# Patient Record
Sex: Female | Born: 1980 | Race: White | Hispanic: No | Marital: Single | Smoking: Never smoker
Health system: Southern US, Community
[De-identification: ages and names within clinical notes are randomized; demographics above are authoritative.]

## PROBLEM LIST (undated history)

## (undated) DIAGNOSIS — I10 Essential (primary) hypertension: Secondary | ICD-10-CM

## (undated) DIAGNOSIS — F419 Anxiety disorder, unspecified: Secondary | ICD-10-CM

## (undated) HISTORY — PX: GALLBLADDER SURGERY: SHX652

## (undated) HISTORY — DX: Essential (primary) hypertension: I10

## (undated) HISTORY — PX: CHOLECYSTECTOMY: SHX55

## (undated) HISTORY — DX: Anxiety disorder, unspecified: F41.9

---

## 2007-09-23 ENCOUNTER — Encounter: Admission: RE | Admit: 2007-09-23 | Discharge: 2007-09-23 | Payer: Self-pay | Admitting: Family Medicine

## 2009-05-15 ENCOUNTER — Encounter: Admission: RE | Admit: 2009-05-15 | Discharge: 2009-05-15 | Payer: Self-pay | Admitting: Gastroenterology

## 2009-05-24 ENCOUNTER — Encounter: Admission: RE | Admit: 2009-05-24 | Discharge: 2009-05-24 | Payer: Self-pay | Admitting: Gastroenterology

## 2009-08-20 ENCOUNTER — Encounter: Admission: RE | Admit: 2009-08-20 | Discharge: 2009-08-20 | Payer: Self-pay | Admitting: Family Medicine

## 2010-06-02 ENCOUNTER — Encounter: Payer: Self-pay | Admitting: Gastroenterology

## 2010-06-03 ENCOUNTER — Encounter: Payer: Self-pay | Admitting: Gastroenterology

## 2010-11-11 IMAGING — US US ABDOMEN COMPLETE
1 series · 14 of 25 positions shown · non-contrast
Comparison: None.

CLINICAL DATA: Right upper quadrant abdominal pain.

COMPLETE ABDOMINAL ULTRASOUND

[Series 1: us abdomen complete · 0.24mm/px · 14 of 62 slices shown]
[im 1/62]
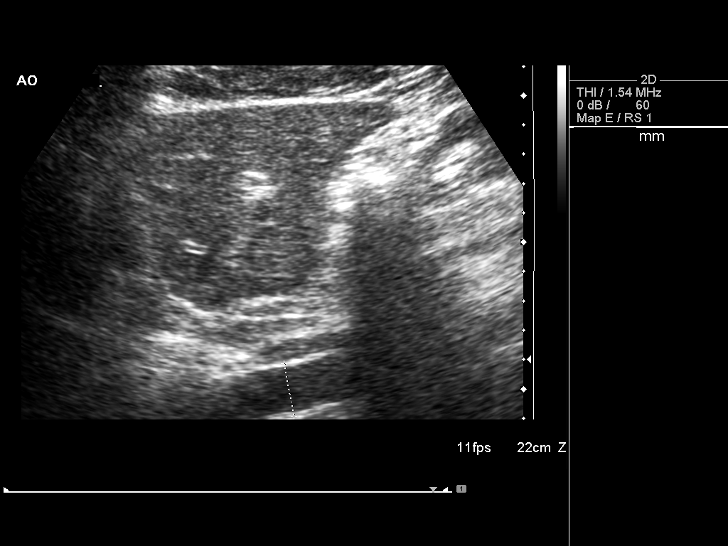
[im 6/62]
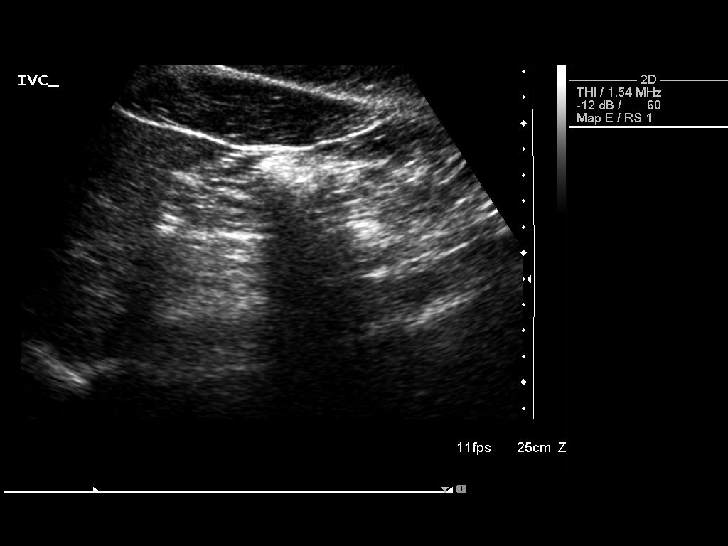
[im 11/62]
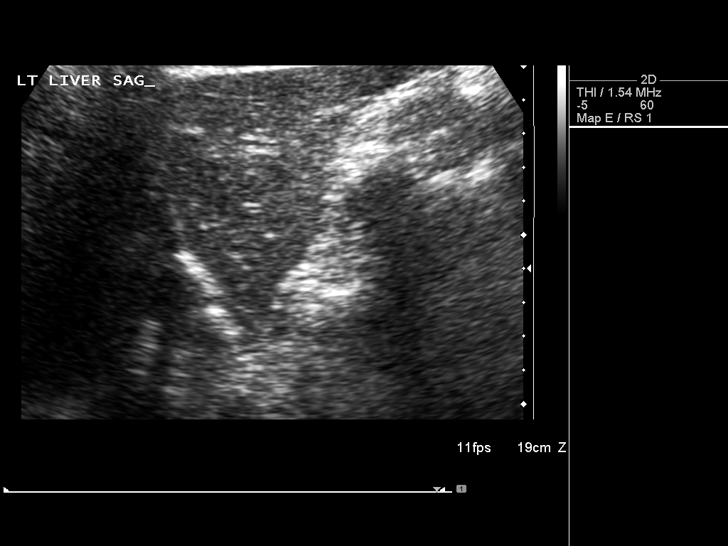
[im 16/62]
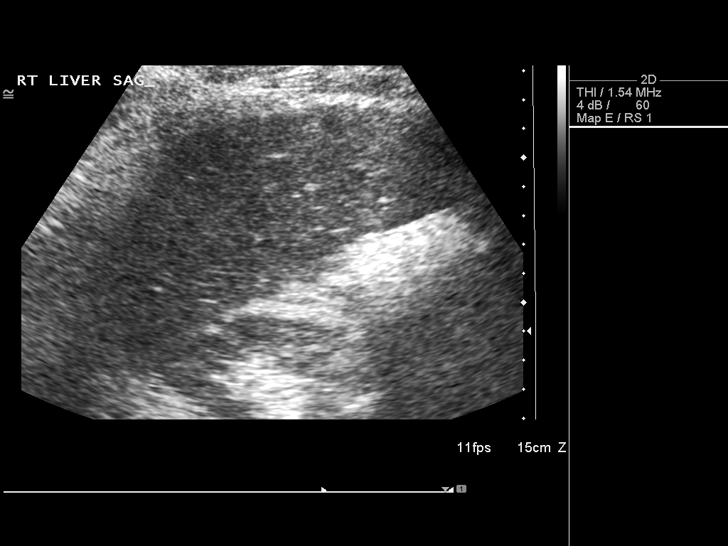
[im 21/62]
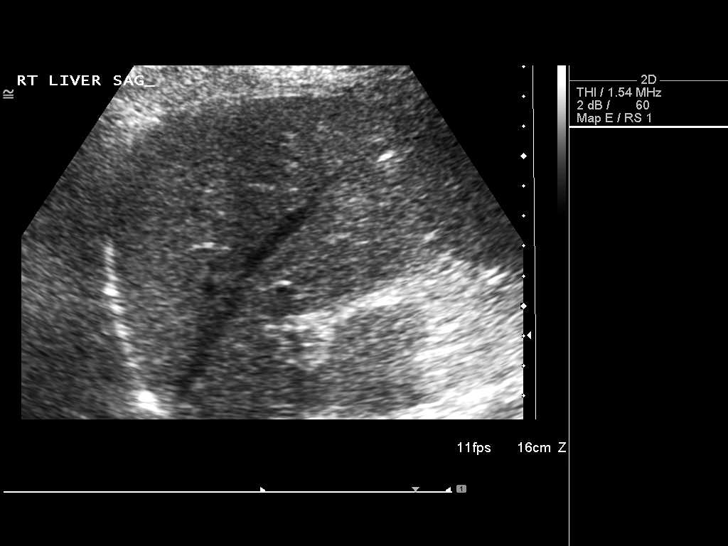
[im 23/62]
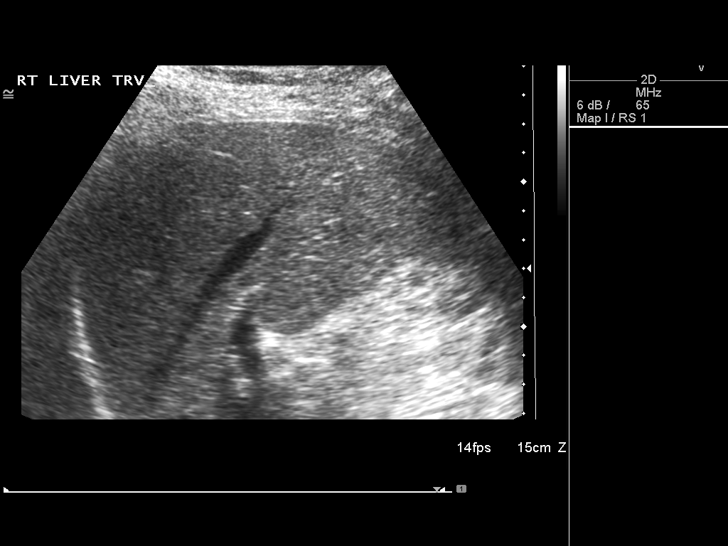
[im 28/62]
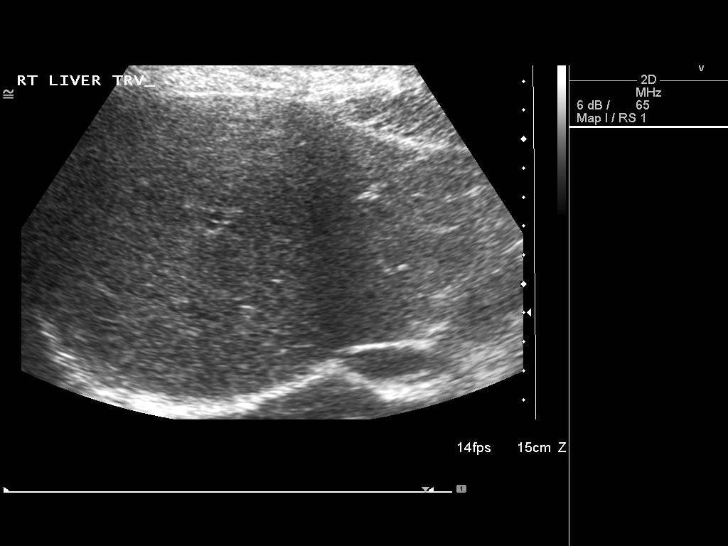
[im 34/62]
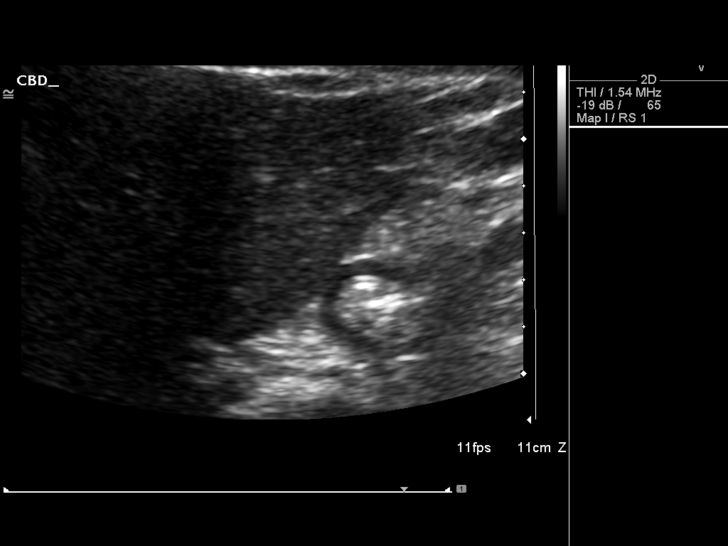
[im 39/62]
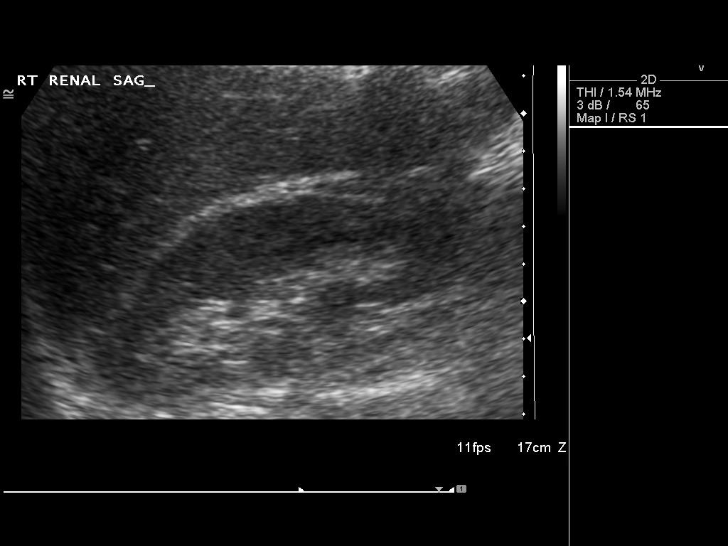
[im 41/62]
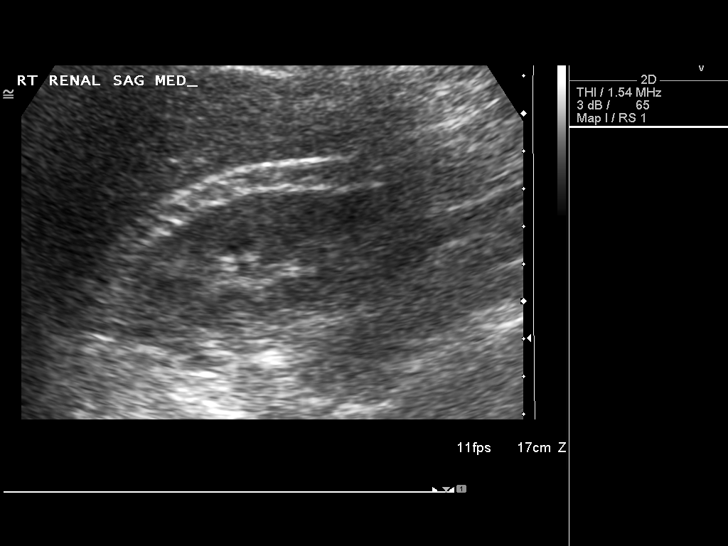
[im 46/62]
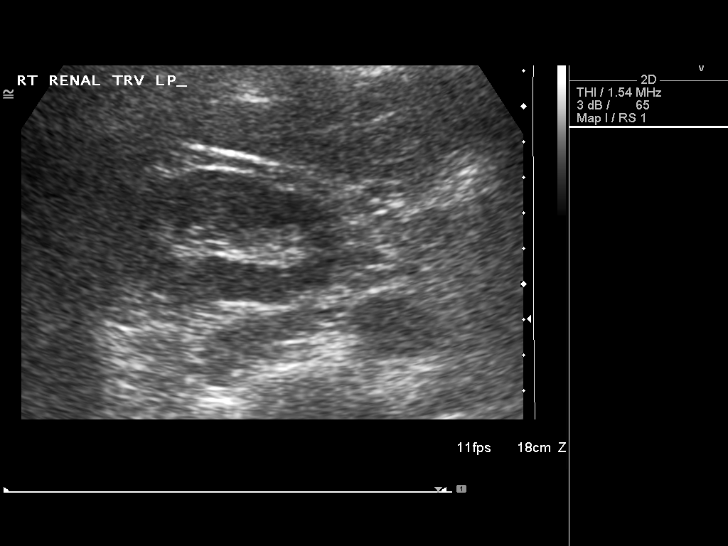
[im 51/62]
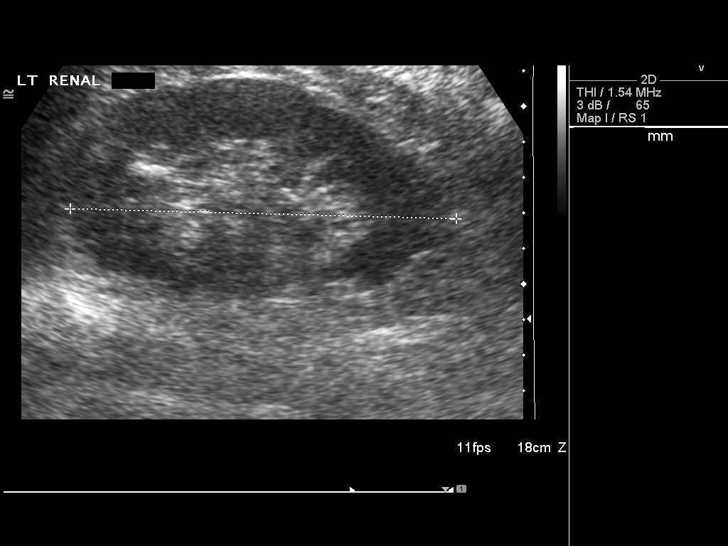
[im 56/62]
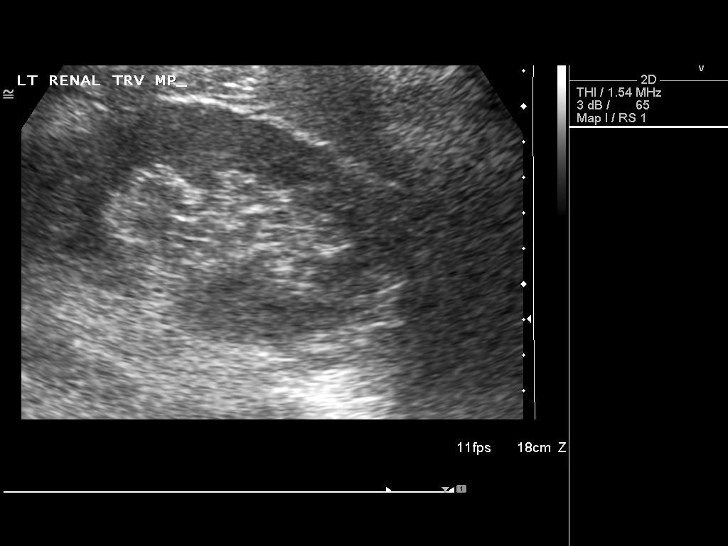
[im 62/62]
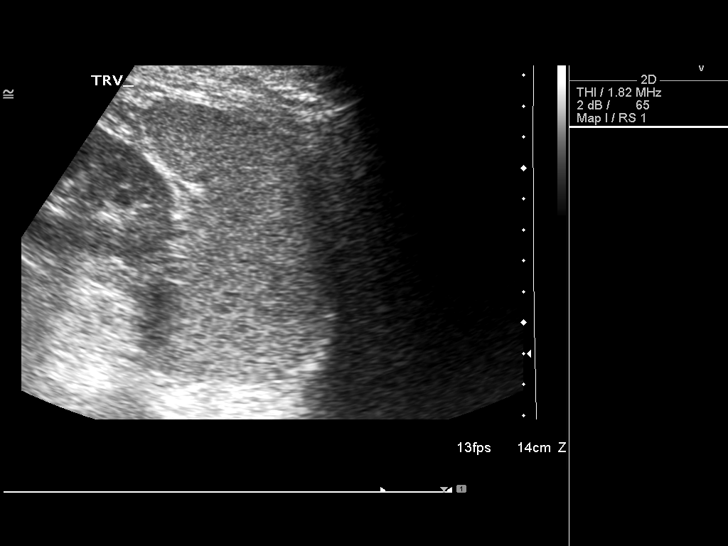

[14 of 25 positions shown; findings below may reference images not displayed]

FINDINGS: Gallbladder:  The gallbladder has previously been removed.

Common bile duct:  .  The common bile duct is normal measuring
mm in diameter.

Liver:  The liver has a normal echogenic pattern.  No ductal
dilatation is seen.

IVC:  Appears normal.

Pancreas:  The pancreatic tail is partially obscured by bowel gas.

Spleen:  The spleen is normal measuring 6.6 cm sagittally.

Right Kidney:  No hydronephrosis is seen.  The right kidney
measures 10.2 cm sagittally.

Left Kidney:  No hydronephrosis.  The left kidney measures 10.9 cm.

Abdominal aorta:  The abdominal aorta is normal in caliber.
IMPRESSION: 1.  Negative ultrasound the abdomen.
2. Prior cholecystectomy.
3.  The tail of the pancreas is obscured by bowel gas.

## 2012-06-06 ENCOUNTER — Ambulatory Visit (INDEPENDENT_AMBULATORY_CARE_PROVIDER_SITE_OTHER): Payer: Commercial Managed Care - PPO | Admitting: Physician Assistant

## 2012-06-06 VITALS — BP 136/88 | HR 96 | Temp 98.7°F | Resp 18 | Ht 66.0 in | Wt 231.0 lb

## 2012-06-06 DIAGNOSIS — J029 Acute pharyngitis, unspecified: Secondary | ICD-10-CM

## 2012-06-06 DIAGNOSIS — I10 Essential (primary) hypertension: Secondary | ICD-10-CM | POA: Insufficient documentation

## 2012-06-06 DIAGNOSIS — F419 Anxiety disorder, unspecified: Secondary | ICD-10-CM | POA: Insufficient documentation

## 2012-06-06 DIAGNOSIS — J069 Acute upper respiratory infection, unspecified: Secondary | ICD-10-CM

## 2012-06-06 MED ORDER — HYDROCOD POLST-CHLORPHEN POLST 10-8 MG/5ML PO LQCR: 5.0000 mL | Freq: Two times a day (BID) | ORAL | Status: DC

## 2012-06-06 MED ORDER — MAGIC MOUTHWASH W/LIDOCAINE: ORAL | Status: DC

## 2012-06-06 MED ORDER — PROMETHAZINE-DM 6.25-15 MG/5ML PO SYRP: 5.0000 mL | ORAL_SOLUTION | Freq: Four times a day (QID) | ORAL | Status: DC | PRN

## 2012-06-06 MED ORDER — IPRATROPIUM BROMIDE 0.06 % NA SOLN: 2.0000 | Freq: Three times a day (TID) | NASAL | Status: DC

## 2012-06-06 NOTE — Progress Notes (Signed)
   [address] [place] [address]   Phone [phone]  Subjective:    Patient ID: Jocelyn Lindsey, female    DOB: Mar 18, 1981, 32 y.o.   MRN: [id number]  HPI Pt presents to clinic with 3 day h.o sore throat that is not improving and mild cold symptoms.  She thought she would be getting more congestion and then sore throat would improve but it has not.  She does have a terrible cough that is keeping her up at night.  She has some PND.  She is using OTC Mucinex which typically helps but does not seem to be helping this time.  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  She has no myalgias or HA.   Review of Systems  Constitutional: Positive for chills. Negative for fever.  HENT: Positive for congestion, sore throat, rhinorrhea (yellow/clear) and postnasal drip.   Respiratory: Positive for cough (worse at night).   Genitourinary: Negative.   Neurological: Negative for headaches.       Objective:   Physical Exam  Vitals reviewed. Constitutional: She appears well-developed and well-nourished.  HENT:  Head: Normocephalic and atraumatic.  Right Ear: Hearing, tympanic membrane, external ear and ear canal normal.  Left Ear: Hearing, tympanic membrane, external ear and ear canal normal.  Nose: Mucosal edema (red) present.  Mouth/Throat: Uvula is midline. Posterior oropharyngeal edema present.  Eyes: Conjunctivae normal are normal.  Neck: Neck supple.  Cardiovascular: Normal rate, regular rhythm and normal heart sounds.   Pulmonary/Chest: Effort normal and breath sounds normal.  Lymphadenopathy:    She has no cervical adenopathy.  Skin: Skin is warm and dry.  Psychiatric: She has a normal mood and affect. Her behavior is normal. Judgment and thought content normal.    Results for orders placed in visit on 06/06/12  POCT RAPID STREP A (OFFICE)      Component Value Range   Rapid Strep A Screen Negative  Negative         Assessment & Plan:    1. Acute pharyngitis  POCT rapid strep A, Alum & Mag Hydroxide-Simeth (MAGIC MOUTHWASH W/LIDOCAINE) SOLN  2. Viral URI with cough  ipratropium (ATROVENT) 0.06 % nasal spray, promethazine-dextromethorphan (PROMETHAZINE-DM) 6.25-15 MG/5ML syrup, DISCONTINUED: chlorpheniramine-HYDROcodone (TUSSIONEX PENNKINETIC ER) 10-8 MG/5ML LQCR   Push fluids, symptomatic treatment.

## 2012-09-02 ENCOUNTER — Ambulatory Visit (INDEPENDENT_AMBULATORY_CARE_PROVIDER_SITE_OTHER): Payer: Commercial Managed Care - PPO | Admitting: Family Medicine

## 2012-09-02 VITALS — BP 134/107 | HR 118 | Temp 99.0°F | Resp 18 | Ht 65.25 in | Wt 232.0 lb

## 2012-09-02 DIAGNOSIS — IMO0002 Reserved for concepts with insufficient information to code with codable children: Secondary | ICD-10-CM

## 2012-09-02 DIAGNOSIS — J209 Acute bronchitis, unspecified: Secondary | ICD-10-CM

## 2012-09-02 MED ORDER — BENZONATATE 200 MG PO CAPS: 200.0000 mg | ORAL_CAPSULE | Freq: Three times a day (TID) | ORAL | Status: DC | PRN

## 2012-09-02 MED ORDER — PROMETHAZINE-DM 6.25-15 MG/5ML PO SYRP: 5.0000 mL | ORAL_SOLUTION | Freq: Four times a day (QID) | ORAL | Status: DC | PRN

## 2012-09-02 NOTE — Progress Notes (Signed)
Subjective:    Patient ID: Jocelyn Lindsey, female    DOB: 10/22/1980, 32 y.o.   MRN: [id number] Chief Complaint  Patient presents with  . Cough  . Foot Pain    left    HPI  Spent spring break in [place] w/ 20 kids.  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 Cough is dry, no Shob or CP.  No f/c.  A little nasal congestion but resoluving, was yellow.  But cough is still continuing. Did have a fever blister and left side of face feels sensitive/tender. Using muinex DM and benadryl w/ minimal relief. Not sleeping due to cough.  During trip feet started to swell while walking tons, both feet were equally swollen and would go down in the morning, but now she is having pain to the anterior of her lateral left ankle. Pain improving w/ ace bandage and ice. Has sprained her ankle in the past.    Past Medical History  Diagnosis Date  . Anxiety   . Hypertension    Current Outpatient Prescriptions on File Prior to Visit  Medication Sig Dispense Refill  . ALPRAZolam (XANAX) 0.25 MG tablet Take 0.25 mg by mouth at bedtime as needed.      Marland Kitchen ipratropium (ATROVENT) 0.06 % nasal spray Place 2 sprays into the nose 3 (three) times daily.  15 mL  0  . lisinopril (PRINIVIL,ZESTRIL) 10 MG tablet Take 10 mg by mouth daily.      Marland Kitchen omeprazole (PRILOSEC) 20 MG capsule Take 20 mg by mouth daily.      . sertraline (ZOLOFT) 100 MG tablet Take 100 mg by mouth daily.      . Alum & Mag Hydroxide-Simeth (MAGIC MOUTHWASH W/LIDOCAINE) SOLN 1-2 tsp swish gargle spit q1-2h prn sore throat  120 mL  0  . fish oil-omega-3 fatty acids 1000 MG capsule Take 2 g by mouth daily.       No current facility-administered medications on file prior to visit.   No Known Allergies  Review of Systems  Constitutional: Positive for fatigue. Negative for fever, chills, diaphoresis and activity change.  HENT: Positive for congestion. Negative for sore throat and rhinorrhea.   Respiratory: Positive for  cough. Negative for choking, chest tightness, shortness of breath and wheezing.   Cardiovascular: Positive for leg swelling. Negative for chest pain and palpitations.  Musculoskeletal: Positive for myalgias, joint swelling and arthralgias. Negative for back pain and gait problem.  Skin: Negative for rash.  Neurological: Negative for numbness.  Hematological: Negative for adenopathy.  Psychiatric/Behavioral: Positive for sleep disturbance.      BP 134/107  Pulse 118  Temp(Src) 99 F (37.2 C) (Oral)  Resp 18  Ht 5' 5.25" (1.657 m)  Wt 232 lb (105.235 kg)  BMI 38.33 kg/m2  SpO2 99% Objective:   Physical Exam  Constitutional: She is oriented to person, place, and time. She appears well-developed and well-nourished. No distress.  HENT:  Head: Normocephalic and atraumatic.  Right Ear: Tympanic membrane, external ear and ear canal normal.  Left Ear: Tympanic membrane, external ear and ear canal normal.  Nose: Nose normal. No mucosal edema or rhinorrhea.  Mouth/Throat: Uvula is midline, oropharynx is clear and moist and mucous membranes are normal. No oropharyngeal exudate.  Eyes: Conjunctivae are normal. Right eye exhibits no discharge. Left eye exhibits no discharge. No scleral icterus.  Neck: Normal range of motion. Neck supple.  Cardiovascular: Normal rate, regular rhythm, normal heart sounds and intact  distal pulses.   Pulmonary/Chest: Effort normal and breath sounds normal.  Musculoskeletal:       Left ankle: She exhibits normal range of motion, no swelling, no ecchymosis, no deformity, no laceration and normal pulse. Tenderness. AITFL tenderness found. No lateral malleolus, no medial malleolus, no CF ligament, no posterior TFL, no head of 5th metatarsal and no proximal fibula tenderness found. Achilles tendon normal.  Lymphadenopathy:    She has no cervical adenopathy.  Neurological: She is alert and oriented to person, place, and time.  Skin: Skin is warm and dry. She is not  diaphoretic. No erythema.  Psychiatric: She has a normal mood and affect. Her behavior is normal.      Assessment & Plan:  Acute bronchitis - Plan: promethazine-dextromethorphan (PROMETHAZINE-DM) 6.25-15 MG/5ML syrup - suspect viral etiology due to benign exam and history.  Treat symptomatically, and RTC if worsening.  Ankle sprain and strain, left, sequela - mild overuse injury in area of prior sprains - rec continuing RICE till pain free w/ ace bandage.  Given strengthening exercises to start after all pain completely resolved to avoid repeat injury  Meds ordered this encounter  Medications  . promethazine-dextromethorphan (PROMETHAZINE-DM) 6.25-15 MG/5ML syrup    Sig: Take 5 mLs by mouth 4 (four) times daily as needed for cough.    Dispense:  120 mL    Refill:  1    Order Specific Question:  Supervising Provider    Answer:  DOOLITTLE, ROBERT P [[id number]]  . benzonatate (TESSALON) 200 MG capsule    Sig: Take 1 capsule (200 mg total) by mouth 3 (three) times daily as needed for cough.    Dispense:  30 capsule    Refill:  0

## 2012-09-02 NOTE — Patient Instructions (Addendum)
Acute Bronchitis You have acute bronchitis. This means you have a chest cold. The airways in your lungs are red and sore (inflamed). Acute means it is sudden onset.  CAUSES Bronchitis is most often caused by the same virus that causes a cold. SYMPTOMS   Body aches.  Chest congestion.  Chills.  Cough.  Fever.  Shortness of breath.  Sore throat. TREATMENT  Acute bronchitis is usually treated with rest, fluids, and medicines for relief of fever or cough. Most symptoms should go away after a few days or a week. Increased fluids may help thin your secretions and will prevent dehydration. Your caregiver may give you an inhaler to improve your symptoms. The inhaler reduces shortness of breath and helps control cough. You can take over-the-counter pain relievers or cough medicine to decrease coughing, pain, or fever. A cool-air vaporizer may help thin bronchial secretions and make it easier to clear your chest. Antibiotics are usually not needed but can be prescribed if you smoke, are seriously ill, have chronic lung problems, are elderly, or you are at higher risk for developing complications.Allergies and asthma can make bronchitis worse. Repeated episodes of bronchitis may cause longstanding lung problems. Avoid smoking and secondhand smoke.Exposure to cigarette smoke or irritating chemicals will make bronchitis worse. If you are a cigarette smoker, consider using nicotine gum or skin patches to help control withdrawal symptoms. Quitting smoking will help your lungs heal faster. Recovery from bronchitis is often slow, but you should start feeling better after 2 to 3 days. Cough from bronchitis frequently lasts for 3 to 4 weeks. To prevent another bout of acute bronchitis:  Quit smoking.  Wash your hands frequently to get rid of viruses or use a hand sanitizer.  Avoid other people with cold or virus symptoms.  Try not to touch your hands to your mouth, nose, or eyes. SEEK IMMEDIATE  MEDICAL CARE IF:  You develop increased fever, chills, or chest pain.  You have severe shortness of breath or bloody sputum.  You develop dehydration, fainting, repeated vomiting, or a severe headache.  You have no improvement after 1 week of treatment or you get worse. MAKE SURE YOU:   Understand these instructions.  Will watch your condition.  Will get help right away if you are not doing well or get worse. Document Released: 06/06/2004 Document Revised: 07/22/2011 Document Reviewed: 08/22/2010 [facility] Patient Information 2013 [place], [place]. Acute Ankle Sprain with Phase II Rehab An acute ankle sprain is a partial or complete tear in one or more of the ligaments of the ankle due to traumatic injury. The severity of the injury depends on both the number of ligaments sprained and the grade of sprain. There are 3 grades of sprains.  A grade 1 sprain is a mild sprain. There is a slight pull without obvious tearing. There is no loss of strength, and the muscle and ligament are the correct length.  A grade 2 sprain is a moderate sprain. There is tearing of fibers within the substance of the ligament where it connects two bones or two cartilages. The length of the ligament is increased, and there is usually decreased strength.  A grade 3 sprain is a complete rupture of the ligament and is uncommon. In addition to the grade of sprain, there are 3 types of ankle sprains.  Lateral ankle sprains. This is a sprain of one or more of the 3 ligaments on the outer side (lateral) of the ankle. These are the most common sprains. Medial ankle  sprains. There is one large triangular ligament on the inner side (medial) of the ankle that is susceptible to injury. Medial ankle sprains are less common. Syndesmosis, "high ankle," sprains. The syndesmosis is the ligament that connects the two bones of the lower leg. Syndesmosis sprains usually only occur with very severe ankle sprains. SYMPTOMS  Pain,  tenderness, and swelling in the ankle, starting at the side of injury that may progress to the whole ankle and foot with time.  "Pop" or tearing sensation at the time of injury.  Bruising that may spread to the heel.  Impaired ability to walk soon after injury. CAUSES   Acute ankle sprains are caused by trauma placed on the ankle that temporarily forces or pries the anklebone (talus) out of its normal socket.  Stretching or tearing of the ligaments that normally hold the joint in place (usually due to a twisting injury). RISK INCREASES WITH:  Previous ankle sprain.  Sports in which the foot may land awkwardly (basketball, volleyball, soccer) or walking or running on uneven or rough surfaces.  Shoes with inadequate support to prevent sideways motion when stress occurs.  Poor strength and flexibility.  Poor balance skills.  Contact sports. PREVENTION  Warm up and stretch properly before activity.  Maintain physical fitness:  Ankle and leg flexibility, muscle strength, and endurance.  Cardiovascular fitness.  Balance training activities.  Use proper technique and have a coach correct improper technique.  Taping, protective strapping, bracing, or high-top tennis shoes may help prevent injury. Initially, tape is best. However, it loses most of its support function within 10 to 15 minutes.  Wear proper fitted protective shoes. Combining high-top shoes with taping or bracing is more effective than using either alone.  Provide the ankle with support during sports and practice activities for 12 months following injury. PROGNOSIS   If treated properly, ankle sprains can be expected to recover completely. However, the length of recovery depends on the degree of injury.  A grade 1 sprain usually heals enough in 5 to 7 days to allow modified activity and requires an average of 6 weeks to heal completely.  A grade 2 sprain requires 6 to 10 weeks to heal completely.  A grade 3  sprain requires 12 to 16 weeks to heal.  A syndesmosis sprain often takes more than 3 months to heal. RELATED COMPLICATIONS   Frequent recurrence of symptoms may result in a chronic problem. Appropriately addressing the problem the first time decreases the frequency of recurrence and optimizes healing time. Severity of initial sprain does not predict the likelihood of later instability.  Injury to other structures (bone, cartilage, or tendon).  Chronically unstable or arthritic ankle joint are possible with repeated sprains. TREATMENT Treatment initially involves the use of ice, medicine, and compression bandages to help reduce pain and inflammation. Ankle sprains are usually immobilized in a walking cast or boot to allow for healing. Crutches may be recommended to reduce pressure on the injury. After immobilization, strengthening and stretching exercises may be necessary to regain strength and a full range of motion. Surgery is rarely needed to treat ankle sprains. MEDICATION   Nonsteroidal anti-inflammatory medicines, such as aspirin and ibuprofen (do not take for the first 3 days after injury or within 7 days before surgery), or other minor pain relievers, such as acetaminophen, are often recommended. Take these as directed by your caregiver. Contact your caregiver immediately if any bleeding, stomach upset, or signs of an allergic reaction occur from these medicines.  Ointments  applied to the skin may be helpful.  Pain relievers may be prescribed as necessary by your caregiver. Do not take prescription pain medicine for longer than 4 to 7 days. Use only as directed and only as much as you need. HEAT AND COLD  Cold treatment (icing) is used to relieve pain and reduce inflammation for acute and chronic cases. Cold should be applied for 10 to 15 minutes every 2 to 3 hours for inflammation and pain and immediately after any activity that aggravates your symptoms. Use ice packs or an ice  massage.  Heat treatment may be used before performing stretching and strengthening activities prescribed by your caregiver. Use a heat pack or a warm soak. SEEK IMMEDIATE MEDICAL CARE IF:   Pain, swelling, or bruising worsens despite treatment.  You experience pain, numbness, discoloration, or coldness in the foot or toes.  New, unexplained symptoms develop. (Drugs used in treatment may produce side effects.) EXERCISES  PHASE II EXERCISES RANGE OF MOTION (ROM) AND STRETCHING EXERCISES - Ankle Sprain, Acute-Phase II, Weeks 3 to 4 After your physician, physical therapist, or athletic trainer feels your knee has made progress significant enough to begin more advanced exercises, he or she may recommend completing some of the following exercises. Although each person heals at different rates, most people will be ready for these exercises between 3 and 4 weeks after their injury. Do not begin these exercises until you have your caregiver's permission. He or she may also advise you to continue with the exercises which you completed in Phase I of your rehabilitation. While completing these exercises, remember:   Restoring tissue flexibility helps normal motion to return to the joints. This allows healthier, less painful movement and activity.  An effective stretch should be held for at least 30 seconds.  A stretch should never be painful. You should only feel a gentle lengthening or release in the stretched tissue. RANGE OF MOTION - Ankle Plantar Flexion   Sit with your right / left leg crossed over your opposite knee.  Use your opposite hand to pull the top of your foot and toes toward you.  You should feel a gentle stretch on the top of your foot/ankle. Hold this position for __________. Repeat __________ times. Complete __________ times per day.  RANGE OF MOTION - Ankle Eversion  Sit with your right / left ankle crossed over your opposite knee.  Grip your foot with your opposite hand,  placing your thumb on the top of your foot and your fingers across the bottom of your foot.  Gently push your foot downward with a slight rotation so your littlest toes rise slightly  You should feel a gentle stretch on the inside of your ankle. Hold the stretch for __________ seconds. Repeat __________ times. Complete this exercise __________ times per day.  RANGE OF MOTION - Ankle Inversion  Sit with your right / left ankle crossed over your opposite knee.  Grip your foot with your opposite hand, placing your thumb on the bottom of your foot and your fingers across the top of your foot.  Gently pull your foot so the smallest toe comes toward you and your thumb pushes the inside of the ball of your foot away from you.  You should feel a gentle stretch on the outside of your ankle. Hold the stretch for __________ seconds. Repeat __________ times. Complete this exercise __________ times per day.  STRETCH - Gastrocsoleus  Sit with your right / left leg extended. Holding onto both  ends of a belt or towel, loop it around the ball of your foot.  Keeping your right / left ankle and foot relaxed and your knee straight, pull your foot and ankle toward you using the belt/towel.  You should feel a gentle stretch behind your calf or knee. Hold this position for __________ seconds. Repeat __________ times. Complete this stretch __________ times per day.  RANGE OF MOTION - Ankle Dorsiflexion, Active Assisted  Remove shoes and sit on a chair that is preferably not on a carpeted surface.  Place right / left foot under knee. Extend your opposite leg for support.  Keeping your heel down, slide your right / left foot back toward the chair until you feel a stretch at your ankle or calf. If you do not feel a stretch, slide your bottom forward to the edge of the chair while still keeping your heel down.  Hold this stretch for __________ seconds. Repeat __________ times. Complete this stretch __________  times per day.  STRETCH  Gastroc, Standing   Place hands on wall.  Extend right / left leg and place a folded washcloth under the arch of your foot for support. Keep the front knee somewhat bent.  Slightly point your toes inward on your back foot.  Keeping your right / left heel on the floor and your knee straight, shift your weight toward the wall, not allowing your back to arch.  You should feel a gentle stretch in the calf. Hold this position for __________ seconds. Repeat __________ times. Complete this stretch __________ times per day. STRETCH  Soleus, Standing  Place hands on wall.  Extend right / left leg and place a folded washcloth under the arch of your foot for support. Keep the front knee somewhat bent.  Slightly point your toes inward on your back foot.  Keep your right / left heel on the floor, bend your back knee, and slightly shift your weight over the back leg so that you feel a gentle stretch deep in your back calf.  Hold this position for __________ seconds. Repeat __________ times. Complete this stretch __________ times per day. STRETCH  Gastrocsoleus, Standing Note: This exercise can place a lot of stress on your foot and ankle. Please complete this exercise only if specifically instructed by your caregiver.   Place the ball of your right / left foot on a step, keeping your other foot firmly on the same step.  Hold on to the wall or a rail for balance.  Slowly lift your other foot, allowing your body weight to press your heel down over the edge of the step.  You should feel a stretch in your right / left calf.  Hold this position for __________ seconds.  Repeat this exercise with a slight bend in your knee. Repeat __________ times. Complete this stretch __________ times per day.  STRENGTHENING EXERCISES - Ankle Sprain, Acute-Phase II Around 3 to 4 weeks after your injury, you may progress to some of these exercises in your rehabilitation program. Do not  begin these until you have your caregiver's permission. Although your condition has improved, the Phase I exercises will continue to be helpful and you may continue to complete them. As you complete strengthening exercises, remember:   Strong muscles with good endurance tolerate stress better.  Do the exercises as initially prescribed by your caregiver. Progress slowly with each exercise, gradually increasing the number of repetitions and weight used under his or her guidance.  You may experience muscle soreness  or fatigue, but the pain or discomfort you are trying to eliminate should never worsen during these exercises. If this pain does worsen, stop and make certain you are following the directions exactly. If the pain is still present after adjustments, discontinue the exercise until you can discuss the trouble with your caregiver. STRENGTH - Plantar-flexors, Standing  Stand with your feet shoulder width apart. Steady yourself with a wall or table using as little support as needed.  Keeping your weight evenly spread over the width of your feet, rise up on your toes.*  Hold this position for __________ seconds. Repeat __________ times. Complete this exercise __________ times per day.  *If this is too easy, shift your weight toward your right / left leg until you feel challenged. Ultimately, you may be asked to do this exercise with your right / left foot only. STRENGTH  Dorsiflexors and Plantar-flexors, Heel/toe Walking  Dorsiflexion: Walk on your heels only. Keep your toes as high as possible.  Walk for ____________________ seconds/feet.  Repeat __________ times. Complete __________ times per day.  Plantar flexion: Walk on your toes only. Keep your heels as high as possible.  Walk for ____________________ seconds/feet. Repeat __________ times. Complete __________ times per day.  BALANCE  Tandem Walking  Place your uninjured foot on a line 2 to 4 inches wide and at least 10 feet  long.  Keeping your balance without using anything for extra support, place your right / left heel directly in front of your other foot.  Slowly raise your back foot up, lifting from the heel to the toes, and place it directly in front of the right / left foot.  Continue to walk along the line slowly. Walk for ____________________ feet. Repeat ____________________ times. Complete ____________________ times per day. BALANCE - Inversion/Eversion Use caution, these are advanced level exercises. Do not begin them until you are advised to do so.   Create a balance board using a sturdy board about 1  feet long and at 1 to 1  feet wide and a 1  inch diameter rod or pipe that is as long as the board's width. A copper pipe or a solid broomstick work well.  Stand on a non-carpeted surface near a countertop or wall. Step onto the board so that your feet are hip-width apart and equally straddle the rod/pipe.  Keeping your feet in place, complete these two exercises without shifting your upper body or hips:  Tip the board from side-to-side. Control the movement so the board does not forcefully strike the ground. The board should silently tap the ground.  Tip the board side-to-side without striking the ground. Occasionally pause and maintain a steady position at various points.  Repeat the first two exercises, but use only your right / left foot. Place your right / left foot directly over the rod/pipe. Repeat __________ times. Complete this exercise __________ times a day. BALANCE - Plantar/Dorsi Flexion Use caution, these are advanced level exercises. Do not begin them until you are advised to do so.   Create a balance board using a sturdy board about 1  feet long and at 1 to 1  feet wide and a 1  inch diameter rod or pipe that is as long as the board's width. A copper pipe or a solid broomstick work well.  Stand on a non-carpeted surface near a countertop or wall. Stand on the board so that the  rod/pipe runs under the arches in your feet.  Keeping your feet in place,  complete these two exercises without shifting your upper body or hips:  Tip the board from side-to-side. Control the movement so the board does not forcefully strike the ground. The board should silently tap the ground.  Tip the board side-to-side without striking the ground. Occasionally pause and maintain a steady position at various points.  Repeat the first two exercises, but use only your right / left foot. Stand in the center of the board. Repeat __________ times. Complete this exercise __________ times a day. STRENGTH  Plantar-flexors, Eccentric Note: This exercise can place a lot of stress on your foot and ankle. Please complete this exercise only if specifically instructed by your caregiver.   Place the balls of your feet on a step. With your hands, use only enough support from a wall or rail to keep your balance.  Keep your knees straight and rise up on your toes.  Slowly shift your weight entirely to your toes and pick up your opposite foot. Gently and with controlled movement, lower your weight through your right / left foot so that your heel drops below the level of the step. You will feel a slight stretch in the back of your calf at the ending position.  Use the healthy leg to help rise up onto the balls of both feet, then lower weight only on the right / left leg again. Build up to 15 repetitions. Then progress to 3 consecutive sets of 15 repetitions.*  After completing the above exercise, complete the same exercise with a slight knee bend (about 30 degrees). Again, build up to 15 repetitions. Then progress to 3 consecutive sets of 15 repetitions.* Perform this exercise __________ times per day.  *When you easily complete 3 sets of 15, your physician, physical therapist, or athletic trainer may advise you to add resistance by wearing a backpack filled with additional weight. Document Released: 08/19/2005  Document Revised: 07/22/2011 Document Reviewed: 08/11/2008 [facility] Patient Information 2013 [place], [place].

## 2012-10-18 ENCOUNTER — Emergency Department (HOSPITAL_BASED_OUTPATIENT_CLINIC_OR_DEPARTMENT_OTHER)
Admission: EM | Admit: 2012-10-18 | Discharge: 2012-10-18 | Disposition: A | Discharge status: Home / Self Care | Payer: No Typology Code available for payment source | Attending: Emergency Medicine | Admitting: Emergency Medicine

## 2012-10-18 ENCOUNTER — Encounter (HOSPITAL_BASED_OUTPATIENT_CLINIC_OR_DEPARTMENT_OTHER): Payer: Self-pay | Admitting: Emergency Medicine

## 2012-10-18 DIAGNOSIS — Y92410 Unspecified street and highway as the place of occurrence of the external cause: Secondary | ICD-10-CM | POA: Insufficient documentation

## 2012-10-18 DIAGNOSIS — IMO0002 Reserved for concepts with insufficient information to code with codable children: Secondary | ICD-10-CM | POA: Insufficient documentation

## 2012-10-18 DIAGNOSIS — Y9389 Activity, other specified: Secondary | ICD-10-CM | POA: Insufficient documentation

## 2012-10-18 DIAGNOSIS — I10 Essential (primary) hypertension: Secondary | ICD-10-CM | POA: Insufficient documentation

## 2012-10-18 DIAGNOSIS — S139XXA Sprain of joints and ligaments of unspecified parts of neck, initial encounter: Secondary | ICD-10-CM | POA: Insufficient documentation

## 2012-10-18 DIAGNOSIS — F411 Generalized anxiety disorder: Secondary | ICD-10-CM | POA: Insufficient documentation

## 2012-10-18 DIAGNOSIS — Z79899 Other long term (current) drug therapy: Secondary | ICD-10-CM | POA: Insufficient documentation

## 2012-10-18 DIAGNOSIS — S161XXA Strain of muscle, fascia and tendon at neck level, initial encounter: Secondary | ICD-10-CM

## 2012-10-18 MED ORDER — CYCLOBENZAPRINE HCL 10 MG PO TABS: 10.0000 mg | ORAL_TABLET | Freq: Two times a day (BID) | ORAL | Status: DC | PRN
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
## 2012-10-18 NOTE — ED Notes (Signed)
See md assessment, unable to complete nursing assessment prior to discharge.
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
## 2013-03-18 ENCOUNTER — Other Ambulatory Visit: Payer: Self-pay

## 2013-04-30 ENCOUNTER — Ambulatory Visit
Admission: RE | Admit: 2013-04-30 | Discharge: 2013-04-30 | Disposition: A | Discharge status: Home / Self Care | Payer: Commercial Managed Care - PPO | Source: Ambulatory Visit | Attending: Emergency Medicine | Admitting: Emergency Medicine

## 2013-04-30 ENCOUNTER — Ambulatory Visit: Payer: Commercial Managed Care - PPO

## 2013-04-30 ENCOUNTER — Telehealth: Payer: Self-pay

## 2013-04-30 ENCOUNTER — Ambulatory Visit (INDEPENDENT_AMBULATORY_CARE_PROVIDER_SITE_OTHER): Payer: Commercial Managed Care - PPO | Admitting: Emergency Medicine

## 2013-04-30 VITALS — BP 120/78 | HR 100 | Temp 98.8°F | Resp 18 | Ht 66.0 in | Wt 235.0 lb

## 2013-04-30 DIAGNOSIS — R109 Unspecified abdominal pain: Secondary | ICD-10-CM

## 2013-04-30 DIAGNOSIS — R110 Nausea: Secondary | ICD-10-CM

## 2013-04-30 DIAGNOSIS — R319 Hematuria, unspecified: Secondary | ICD-10-CM

## 2013-04-30 DIAGNOSIS — R209 Unspecified disturbances of skin sensation: Secondary | ICD-10-CM

## 2013-04-30 DIAGNOSIS — R079 Chest pain, unspecified: Secondary | ICD-10-CM

## 2013-04-30 DIAGNOSIS — N200 Calculus of kidney: Secondary | ICD-10-CM

## 2013-04-30 DIAGNOSIS — R202 Paresthesia of skin: Secondary | ICD-10-CM

## 2013-04-30 DIAGNOSIS — R200 Anesthesia of skin: Secondary | ICD-10-CM

## 2013-04-30 LAB — POCT CBC
Granulocyte percent: 66.1 %G (ref 37–80)
MCH, POC: 29.2 pg (ref 27–31.2)
MID (cbc): 0.4 (ref 0–0.9)
MPV: 10.2 fL (ref 0–99.8)
POC Granulocyte: 3.2 (ref 2–6.9)
POC MID %: 7.3 %M (ref 0–12)
Platelet Count, POC: 226 10*3/uL (ref 142–424)
RBC: 4.73 M/uL (ref 4.04–5.48)
WBC: 4.9 10*3/uL (ref 4.6–10.2)

## 2013-04-30 LAB — POCT UA - MICROSCOPIC ONLY
Casts, Ur, LPF, POC: NEGATIVE
Crystals, Ur, HPF, POC: NEGATIVE
Mucus, UA: POSITIVE

## 2013-04-30 LAB — POCT URINALYSIS DIPSTICK
Bilirubin, UA: NEGATIVE
Glucose, UA: NEGATIVE
Ketones, UA: NEGATIVE
Spec Grav, UA: 1.02
pH, UA: 6

## 2013-04-30 MED ORDER — TAMSULOSIN HCL 0.4 MG PO CAPS: 0.4000 mg | ORAL_CAPSULE | Freq: Every day | ORAL | Status: DC

## 2013-04-30 MED ORDER — CIPROFLOXACIN HCL 250 MG PO TABS: 250.0000 mg | ORAL_TABLET | Freq: Two times a day (BID) | ORAL | Status: DC

## 2013-04-30 NOTE — Patient Instructions (Signed)
Ureteral Colic (Kidney Stones) °Ureteral colic is the result of a condition when kidney stones form inside the kidney. Once kidney stones are formed they may move into the tube that connects the kidney with the bladder (ureter). If this occurs, this condition may cause pain (colic) in the ureter.  °CAUSES  °Pain is caused by stone movement in the ureter and the obstruction caused by the stone. °SYMPTOMS  °The pain comes and goes as the ureter contracts around the stone. The pain is usually intense, sharp, and stabbing in character. The location of the pain may move as the stone moves through the ureter. When the stone is near the kidney the pain is usually located in the back and radiates to the belly (abdomen). When the stone is ready to pass into the bladder the pain is often located in the lower abdomen on the side the stone is located. At this location, the symptoms may mimic those of a urinary tract infection with urinary frequency. Once the stone is located here it often passes into the bladder and the pain disappears completely. °TREATMENT  °· Your caregiver will provide you with medicine for pain relief. °· You may require specialized follow-up X-rays. °· The absence of pain does not always mean that the stone has passed. It may have just stopped moving. If the urine remains completely obstructed, it can cause loss of kidney function or even complete destruction of the involved kidney. It is your responsibility and in your interest that X-rays and follow-ups as suggested by your caregiver are completed. Relief of pain without passage of the stone can be associated with severe damage to the kidney, including loss of kidney function on that side. °· If your stone does not pass on its own, additional measures may be taken by your caregiver to ensure its removal. °HOME CARE INSTRUCTIONS  °· Increase your fluid intake. Water is the preferred fluid since juices containing vitamin C may acidify the urine making it  less likely for certain stones (uric acid stones) to pass. °· Strain all urine. A strainer will be provided. Keep all particulate matter or stones for your caregiver to inspect. °· Take your pain medicine as directed. °· Make a follow-up appointment with your caregiver as directed. °· Remember that the goal is passage of your stone. The absence of pain does not mean the stone is gone. Follow your caregiver's instructions. °· Only take over-the-counter or prescription medicines for pain, discomfort, or fever as directed by your caregiver. °SEEK MEDICAL CARE IF:  °· Pain cannot be controlled with the prescribed medicine. °· You have a fever. °· Pain continues for longer than your caregiver advises it should. °· There is a change in the pain, and you develop chest discomfort or constant abdominal pain. °· You feel faint or pass out. °MAKE SURE YOU:  °· Understand these instructions. °· Will watch your condition. °· Will get help right away if you are not doing well or get worse. °Document Released: 02/06/2005 Document Revised: 08/24/2012 Document Reviewed: 10/24/2010 °[employer]® Patient Information ©2014 [employer]. ° °

## 2013-04-30 NOTE — Telephone Encounter (Signed)
Patient states Dr. Cleta Alberts told her he would call her back with the [place] results, but the [place] tech told her he would not get them until Monday. She wants to make sure Dr. Cleta Alberts will call her tonight.

## 2013-04-30 NOTE — Progress Notes (Signed)
Subjective:    Patient ID: Jocelyn Lindsey, female    DOB: [birth year], 32 y.o.   MRN: [id number]  HPI Patient presents today with pelvic pain. She felt maybe it was a UTI. She has urgency with out being able to urinate. She has never had a kidney stone. She has lower abdominal pain. No discharge. She has a IUD in place currently. She has no complaints hematuria. She states she has never had infection in her IUD.  She has had some stress recently. Her husband had a heart attack on Dec. 4. She has spent a lot of time at the hospital.    Review of Systems     Objective:   Physical Exam patient is alert and cooperative not ill-appearing. Chest clear heart regular rate no murmurs abdomen slightly tender suprapubic area no rebound   Results for orders placed in visit on 04/30/13  POCT URINALYSIS DIPSTICK      Result Value Range   Color, UA yellow     Clarity, UA clear     Glucose, UA neg     Bilirubin, UA neg     Ketones, UA neg     Spec Grav, UA 1.020     Blood, UA mod     pH, UA 6.0     Protein, UA neg     Urobilinogen, UA 1.0     Nitrite, UA neg     Leukocytes, UA Negative    POCT UA - MICROSCOPIC ONLY      Result Value Range   WBC, Ur, HPF, POC 0-2     RBC, urine, microscopic tntc     Bacteria, U Microscopic 1+     Mucus, UA pos     Epithelial cells, urine per micros 1-2     Crystals, Ur, HPF, POC neg     Casts, Ur, LPF, POC neg     Yeast, UA neg     [facility] reading (PRIMARY) by  Dr Filomena Jungling is in place. No stones are seen. Results for orders placed in visit on 04/30/13  POCT URINALYSIS DIPSTICK      Result Value Range   Color, UA yellow     Clarity, UA clear     Glucose, UA neg     Bilirubin, UA neg     Ketones, UA neg     Spec Grav, UA 1.020     Blood, UA mod     pH, UA 6.0     Protein, UA neg     Urobilinogen, UA 1.0     Nitrite, UA neg     Leukocytes, UA Negative    POCT UA - MICROSCOPIC ONLY      Result Value Range   WBC, Ur, HPF, POC 0-2     RBC,  urine, microscopic tntc     Bacteria, U Microscopic 1+     Mucus, UA pos     Epithelial cells, urine per micros 1-2     Crystals, Ur, HPF, POC neg     Casts, Ur, LPF, POC neg     Yeast, UA neg    POCT URINE PREGNANCY      Result Value Range   Preg Test, Ur Negative    POCT CBC      Result Value Range   WBC 4.9  4.6 - 10.2 K/uL   Lymph, poc 1.3  0.6 - 3.4   POC LYMPH PERCENT 26.6  10 - 50 %L   MID (cbc) 0.4  0 - 0.9   POC MID % 7.3  0 - 12 %M   POC Granulocyte 3.2  2 - 6.9   Granulocyte percent 66.1  37 - 80 %G   RBC 4.73  4.04 - 5.48 M/uL   Hemoglobin 13.8  12.2 - 16.2 g/dL   HCT, POC 16.1  09.6 - 47.9 %   MCV 95.9  80 - 97 fL   MCH, POC 29.2  27 - 31.2 pg   MCHC 30.4 (*) 31.8 - 35.4 g/dL   RDW, POC 04.5     Platelet Count, POC 226  142 - 424 K/uL   MPV 10.2  0 - 99.8 fL        Assessment & Plan:  Call from radiologist he does see what he thinks are stones present on the left side. We'll go ahead and do a CT urogram place the patient on Flomax and Cipro pending culture results. CT did show a 6 mm stone in the upper pelvis of the left kidney which was nonobstructing.

## 2013-05-01 ENCOUNTER — Encounter (HOSPITAL_COMMUNITY): Payer: Self-pay | Admitting: Emergency Medicine

## 2013-05-01 ENCOUNTER — Emergency Department (HOSPITAL_COMMUNITY)
Admission: EM | Admit: 2013-05-01 | Discharge: 2013-05-01 | Disposition: A | Discharge status: Home / Self Care | Payer: Commercial Managed Care - PPO | Attending: Emergency Medicine | Admitting: Emergency Medicine

## 2013-05-01 DIAGNOSIS — R42 Dizziness and giddiness: Secondary | ICD-10-CM | POA: Insufficient documentation

## 2013-05-01 DIAGNOSIS — Z3202 Encounter for pregnancy test, result negative: Secondary | ICD-10-CM | POA: Insufficient documentation

## 2013-05-01 DIAGNOSIS — R109 Unspecified abdominal pain: Secondary | ICD-10-CM

## 2013-05-01 DIAGNOSIS — I10 Essential (primary) hypertension: Secondary | ICD-10-CM | POA: Insufficient documentation

## 2013-05-01 DIAGNOSIS — F411 Generalized anxiety disorder: Secondary | ICD-10-CM | POA: Insufficient documentation

## 2013-05-01 DIAGNOSIS — Z792 Long term (current) use of antibiotics: Secondary | ICD-10-CM | POA: Insufficient documentation

## 2013-05-01 DIAGNOSIS — Z79899 Other long term (current) drug therapy: Secondary | ICD-10-CM | POA: Insufficient documentation

## 2013-05-01 DIAGNOSIS — N200 Calculus of kidney: Secondary | ICD-10-CM | POA: Insufficient documentation

## 2013-05-01 LAB — CBC WITH DIFFERENTIAL/PLATELET
Basophils Absolute: 0 10*3/uL (ref 0.0–0.1)
Basophils Relative: 0 % (ref 0–1)
MCHC: 34.4 g/dL (ref 30.0–36.0)
Neutro Abs: 3.3 10*3/uL (ref 1.7–7.7)
Neutrophils Relative %: 70 % (ref 43–77)
Platelets: 207 10*3/uL (ref 150–400)
RDW: 12.8 % (ref 11.5–15.5)

## 2013-05-01 LAB — COMPREHENSIVE METABOLIC PANEL
Albumin: 3.6 g/dL (ref 3.5–5.2)
BUN: 17 mg/dL (ref 6–23)
Calcium: 8.8 mg/dL (ref 8.4–10.5)
Chloride: 104 mEq/L (ref 96–112)
Creatinine, Ser: 0.75 mg/dL (ref 0.50–1.10)
GFR calc non Af Amer: >90 mL/min (ref >90)
Total Bilirubin: 0.3 mg/dL (ref 0.3–1.2)

## 2013-05-01 LAB — URINALYSIS, ROUTINE W REFLEX MICROSCOPIC
Ketones, ur: NEGATIVE mg/dL
Leukocytes, UA: NEGATIVE
Nitrite: NEGATIVE
Protein, ur: NEGATIVE mg/dL

## 2013-05-01 LAB — LIPASE, BLOOD: Lipase: 34 U/L (ref 11–59)

## 2013-05-01 MED ORDER — PROMETHAZINE HCL 25 MG/ML IJ SOLN
12.5000 mg | Freq: Once | INTRAMUSCULAR | Status: AC
  Administered 2013-05-01: 12.5 mg via INTRAVENOUS
  Filled 2013-05-01: qty 1

## 2013-05-01 MED ORDER — SODIUM CHLORIDE 0.9 % IV SOLN
1000.0000 mL | INTRAVENOUS | Status: DC
  Administered 2013-05-01: 1000 mL via INTRAVENOUS

## 2013-05-01 MED ORDER — SODIUM CHLORIDE 0.9 % IV SOLN
1000.0000 mL | Freq: Once | INTRAVENOUS | Status: AC
  Administered 2013-05-01: 1000 mL via INTRAVENOUS

## 2013-05-01 MED ORDER — HYDROCODONE-ACETAMINOPHEN 5-325 MG PO TABS: 1.0000 | ORAL_TABLET | ORAL | Status: DC | PRN

## 2013-05-01 MED ORDER — PROMETHAZINE HCL 25 MG PO TABS: 25.0000 mg | ORAL_TABLET | Freq: Four times a day (QID) | ORAL | Status: DC | PRN

## 2013-05-01 NOTE — ED Notes (Signed)
MD at bedside. 

## 2013-05-01 NOTE — ED Provider Notes (Signed)
CSN: [id number]     Arrival date & time 05/01/13  3244 History   First MD Initiated Contact with Patient 05/01/13 [phone]     Chief Complaint  Patient presents with  . Abdominal Pain    HPI Comments: The last few days she has been having abdominal pain and urinary discomfort. She went to an urgent care yesterday and had lab tests which showed blood in her urine.   She had a CT scan that showed a 6mm renal stone but no ureteral stone.   This AM she had an episode of nausea and lightheadedness.  It was severe and she thought she was going to pass out.  The symptoms started about two days ago.  The pain is primarily in the lower abdomen. More constant since last evening.  She does have urinary frequency but is only going a small amount.    No vaginal discharge or bleeding.  The lightheadedness and nausea have mostly resolved  Patient is a 32 y.o. female presenting with abdominal pain. The history is provided by the patient.  Abdominal Pain Pain location:  Suprapubic Pain quality: sharp   Pain radiates to:  Does not radiate Pain severity:  Moderate Timing:  Constant Context: not trauma   Relieved by:  Nothing Ineffective treatments: only antinausea medications and flomax, she isnt taking anything for pain. Associated symptoms: dysuria and nausea   Associated symptoms: no diarrhea, no fever, no vaginal bleeding, no vaginal discharge and no vomiting     Past Medical History  Diagnosis Date  . Anxiety   . Hypertension    Past Surgical History  Procedure Laterality Date  . Gallbladder surgery    . Cholecystectomy     Family History  Problem Relation Age of Onset  . Hyperlipidemia Father   . Diabetes Maternal Grandmother   . Heart disease Maternal Grandfather   . Hypertension Paternal Grandmother   . Congestive Heart Failure Paternal Grandfather   . Heart disease Paternal Grandfather    History  Substance Use Topics  . Smoking status: Never Smoker   . Smokeless tobacco: Not on file   . Alcohol Use: No   OB History   Grav Para Term Preterm Abortions TAB SAB Ect Mult Living                 Review of Systems  Constitutional: Negative for fever.  Gastrointestinal: Positive for nausea and abdominal pain. Negative for vomiting and diarrhea.  Genitourinary: Positive for dysuria and urgency. Negative for flank pain, vaginal bleeding and vaginal discharge.  Musculoskeletal: Negative for back pain.  Skin: Negative for rash.  Neurological: Positive for light-headedness.  All other systems reviewed and are negative.    Allergies  Hydrochlorothiazide and Zofran  Home Medications   Current Outpatient Rx  Name  Route  Sig  Dispense  Refill  . Acetylcysteine (N-ACETYL-L-CYSTEINE) 600 MG CAPS   Oral   Take 600 mg by mouth 2 (two) times daily.         Marland Kitchen ALPRAZolam (XANAX) 0.25 MG tablet   Oral   Take 0.25 mg by mouth 3 (three) times daily as needed for anxiety or sleep.         Marland Kitchen amitriptyline (ELAVIL) 10 MG tablet   Oral   Take 10 mg by mouth at bedtime as needed for sleep.         . ciprofloxacin (CIPRO) 250 MG tablet   Oral   Take 1 tablet (250 mg total) by mouth 2 (  two) times daily.   14 tablet   0   . fish oil-omega-3 fatty acids 1000 MG capsule   Oral   Take 1 g by mouth 2 (two) times daily.          Marland Kitchen lisinopril (PRINIVIL,ZESTRIL) 10 MG tablet   Oral   Take 10 mg by mouth every evening.          Marland Kitchen omeprazole (PRILOSEC) 20 MG capsule   Oral   Take 20 mg by mouth every morning.          . sertraline (ZOLOFT) 100 MG tablet   Oral   Take 100-150 mg by mouth 2 (two) times daily. Take 100mg  in the morning and 150 mg in the evening         . HYDROcodone-acetaminophen (NORCO/VICODIN) 5-325 MG per tablet   Oral   Take 1-2 tablets by mouth every 4 (four) hours as needed.   16 tablet   0   . promethazine (PHENERGAN) 25 MG tablet   Oral   Take 1 tablet (25 mg total) by mouth every 6 (six) hours as needed for nausea or vomiting.   30  tablet   0    BP 133/71  Pulse 80  Temp(Src) 98.2 F (36.8 C) (Oral)  Resp 18  Wt 230 lb (104.327 kg)  SpO2 97% Physical Exam  Nursing note and vitals reviewed. Constitutional: She appears well-developed and well-nourished. No distress.  HENT:  Head: Normocephalic and atraumatic.  Right Ear: External ear normal.  Left Ear: External ear normal.  Eyes: Conjunctivae are normal. Right eye exhibits no discharge. Left eye exhibits no discharge. No scleral icterus.  Neck: Neck supple. No tracheal deviation present.  Cardiovascular: Normal rate, regular rhythm and intact distal pulses.   Pulmonary/Chest: Effort normal and breath sounds normal. No stridor. No respiratory distress. She has no wheezes. She has no rales.  Abdominal: Soft. Bowel sounds are normal. She exhibits no distension. There is no tenderness. There is no rebound and no guarding.  Musculoskeletal: She exhibits no edema and no tenderness.  Neurological: She is alert. She has normal strength. No sensory deficit. Cranial nerve deficit:  no gross defecits noted. She exhibits normal muscle tone. She displays no seizure activity. Coordination normal.  Skin: Skin is warm and dry. No rash noted.  Psychiatric: She has a normal mood and affect.    ED Course  Procedures (including critical care time) Labs Review Labs Reviewed  COMPREHENSIVE METABOLIC PANEL - Abnormal; Notable for the following:    Glucose, Bld 105 (*)    All other components within normal limits  LIPASE, BLOOD  URINALYSIS, ROUTINE W REFLEX MICROSCOPIC  CBC WITH DIFFERENTIAL  PREGNANCY, URINE   Imaging Review Ct Abdomen Pelvis Wo Contrast  04/30/2013   CLINICAL DATA:  Lower pelvic pain.  Nausea.  Microhematuria.  EXAM: CT ABDOMEN AND PELVIS WITHOUT CONTRAST  TECHNIQUE: Multidetector CT imaging of the abdomen and pelvis was performed following the standard protocol without intravenous contrast.  COMPARISON:  No priors.  FINDINGS: Lung Bases: Unremarkable.   Abdomen/Pelvis: 6 mm nonobstructive calculus in the upper pole collecting system of the left kidney. No additional calculi are noted within the right renal collecting system, along the course of either ureter, or within the lumen of the urinary bladder. No hydroureteronephrosis or perinephric stranding to suggest urinary tract obstruction at this time. The unenhanced appearance of the kidneys is otherwise unremarkable bilaterally.  Status post cholecystectomy. The unenhanced appearance of the visualized liver,  pancreas, spleen and bilateral adrenal glands is unremarkable. No significant volume of ascites. No pneumoperitoneum. No pathologic distention of small bowel. Normal appendix. No definite pathologic lymphadenopathy identified within the abdomen or pelvis on today's non contrast CT examination. An IUD is present in the endometrial canal. Uterus and bilateral ovaries are otherwise unremarkable in appearance on today's non contrast CT examination.  Musculoskeletal: There are no aggressive appearing lytic or blastic lesions noted in the visualized portions of the skeleton.  IMPRESSION: 1. 6 mm nonobstructive calculus in the upper pole collecting system of the left kidney. 2. No ureteral stones or findings of urinary tract obstruction at this time. 3. Normal appendix. 4. Status post cholecystectomy. 5. Additional incidental findings, as above.   Electronically Signed   By: Trudie Reed M.D.   On: 04/30/2013 18:16   Dg Abd 1 View  04/30/2013   CLINICAL DATA:  Hematuria.  EXAM: ABDOMEN - 1 VIEW  COMPARISON:  None.  FINDINGS: An IUD is present. Evidence for cholecystectomy clips. Nonspecific bowel gas pattern. There is concern for three calcifications in the region of the left renal shadow. The largest calcification roughly measures 6 mm in the upper pole. No definite stones along the expected course of the ureters. No evidence for pelvic calcifications.  IMPRESSION: Possible left renal calculi.    Electronically Signed   By: Richarda Overlie M.D.   On: 04/30/2013 15:10    EKG Interpretation   None       MDM   1. Abdominal pain    Patient had a CT scan that showed a kidney stone within the left kidney. She did not have any ureteral stones. Patient however did have hematuria yesterday.  The patient's laboratory tests today are unremarkable. Her exam is benign.  It is possible that her lightheadedness could have been related to the Flomax. This patient does not have a ureteral stone, I will have her discontinue that.  At this time there does not appear to be any evidence of an acute emergency medical condition and the patient appears stable for discharge with appropriate outpatient follow up.  Warning signs and precautions were discussed    Celene Kras, MD 05/01/13 [phone]

## 2013-05-01 NOTE — ED Notes (Addendum)
C/o low mid abd pain onset Thursday night. Pt seen at urgent care yesterday, CT done, u/a done, +significant nausea

## 2013-05-02 LAB — URINE CULTURE

## 2013-05-02 NOTE — Telephone Encounter (Signed)
Dr Cleta Alberts already spoke with pt.

## 2014-06-09 ENCOUNTER — Ambulatory Visit (INDEPENDENT_AMBULATORY_CARE_PROVIDER_SITE_OTHER): Payer: Commercial Managed Care - PPO

## 2014-06-09 ENCOUNTER — Other Ambulatory Visit: Payer: Self-pay | Admitting: Emergency Medicine

## 2014-06-09 ENCOUNTER — Ambulatory Visit (INDEPENDENT_AMBULATORY_CARE_PROVIDER_SITE_OTHER): Payer: Commercial Managed Care - PPO | Admitting: Emergency Medicine

## 2014-06-09 VITALS — BP 102/78 | HR 98 | Temp 99.1°F | Resp 12 | Ht 65.5 in | Wt 225.0 lb

## 2014-06-09 DIAGNOSIS — M25552 Pain in left hip: Secondary | ICD-10-CM

## 2014-06-09 DIAGNOSIS — M7072 Other bursitis of hip, left hip: Secondary | ICD-10-CM

## 2014-06-09 MED ORDER — ACETAMINOPHEN-CODEINE #3 300-30 MG PO TABS: 1.0000 | ORAL_TABLET | ORAL | Status: AC | PRN

## 2014-06-09 MED ORDER — NAPROXEN SODIUM 550 MG PO TABS: 550.0000 mg | ORAL_TABLET | Freq: Two times a day (BID) | ORAL | Status: AC

## 2014-06-09 NOTE — Progress Notes (Signed)
[facility] [address], [place] [id number] [phone]  Date:  06/09/2014   Name:  Jocelyn Lindsey   DOB:  1981-01-19   MRN:  [id number]  PCP:  No primary care provider on file.    Chief Complaint: Hip Pain   History of Present Illness:  Jocelyn Lindsey is a 34 y.o. very pleasant female patient who presents with the following:  Injured left ankle in an inversion injury yesterday afternoon.  Ankle is fine with no pain She awoke last night with marked pain in left hip that prevented her return to sleep. Pain with ambulation and standing. No improvement with over the counter medications or other home remedies.  Denies other complaint or health concern today.   Patient Active Problem List   Diagnosis Date Noted  . Nephrolithiasis 04/30/2013  . Anxiety 06/06/2012  . HTN (hypertension) 06/06/2012    Past Medical History  Diagnosis Date  . Anxiety   . Hypertension     Past Surgical History  Procedure Laterality Date  . Gallbladder surgery    . Cholecystectomy      History  Substance Use Topics  . Smoking status: Never Smoker   . Smokeless tobacco: Never Used  . Alcohol Use: No    Family History  Problem Relation Age of Onset  . Hyperlipidemia Father   . Diabetes Maternal Grandmother   . Heart disease Maternal Grandfather   . Hypertension Paternal Grandmother   . Congestive Heart Failure Paternal Grandfather   . Heart disease Paternal Grandfather     Allergies  Allergen Reactions  . Hydrochlorothiazide Hypertension    Reverse effect - increased BP  . Zofran [Ondansetron Hcl] Other (See Comments)    Severe constipation    Medication list has been reviewed and updated.  Current Outpatient Prescriptions on File Prior to Visit  Medication Sig Dispense Refill  . Acetylcysteine (N-ACETYL-L-CYSTEINE) 600 MG CAPS Take 600 mg by mouth 2 (two) times daily.    Marland Kitchen. ALPRAZolam (XANAX) 0.25 MG tablet Take 0.25 mg by mouth 3 (three) times  daily as needed for anxiety or sleep.    Marland Kitchen. amitriptyline (ELAVIL) 10 MG tablet Take 10 mg by mouth at bedtime as needed for sleep.    . fish oil-omega-3 fatty acids 1000 MG capsule Take 1 g by mouth 2 (two) times daily.     Marland Kitchen. lisinopril (PRINIVIL,ZESTRIL) 10 MG tablet Take 10 mg by mouth every evening.     Marland Kitchen. omeprazole (PRILOSEC) 20 MG capsule Take 20 mg by mouth every morning.     . sertraline (ZOLOFT) 100 MG tablet Take 100-150 mg by mouth 2 (two) times daily. Take 100mg  in the morning and 150 mg in the evening     No current facility-administered medications on file prior to visit.    Review of Systems:  As per HPI, otherwise negative.    Physical Examination: Filed Vitals:   06/09/14 0846  BP: 102/78  Pulse: 98  Temp: 99.1 F (37.3 C)  Resp: 12   Filed Vitals:   06/09/14 0846  Height: 5' 5.5" (1.664 m)  Weight: 225 lb (102.059 kg)   Body mass index is 36.86 kg/(m^2). Ideal Body Weight: Weight in (lb) to have BMI = 25: 152.2   GEN: WDWN, NAD, Non-toxic, Alert & Oriented x 3 HEENT: Atraumatic, Normocephalic.  Ears and Nose: No external deformity. EXTR: No clubbing/cyanosis/edema NEURO: Normal gait.  PSYCH: Normally interactive. Conversant. Not depressed or anxious appearing.  Calm demeanor.  Left hip:  Tender and guards.  No ecchymosis or deformity  Assessment and Plan: Bursitis left hip Anaprox T#3 ?ortho  Signed,  Phillips Odor, MD   [facility] reading (PRIMARY) by  Dr. Dareen Piano.  Old chip off acetabulum.

## 2014-06-09 NOTE — Patient Instructions (Signed)
Hip Bursitis Bursitis is a swelling and soreness (inflammation) of a fluid-filled sac (bursa). This sac overlies and protects the joints.  CAUSES   Injury.  Overuse of the muscles surrounding the joint.  Arthritis.  Gout.  Infection.  Cold weather.  Inadequate warm-up and conditioning prior to activities. The cause may not be known.  SYMPTOMS   Mild to severe irritation.  Tenderness and swelling over the outside of the hip.  Pain with motion of the hip.  If the bursa becomes infected, a fever may be present. Redness, tenderness, and warmth will develop over the hip. Symptoms usually lessen in 3 to 4 weeks with treatment, but can come back. TREATMENT If conservative treatment does not work, your caregiver may advise draining the bursa and injecting cortisone into the area. This may speed up the healing process. This may also be used as an initial treatment of choice. HOME CARE INSTRUCTIONS   Apply ice to the affected area for 15-20 minutes every 3 to 4 hours while awake for the first 2 days. Put the ice in a plastic bag and place a towel between the bag of ice and your skin.  Rest the painful joint as much as possible, but continue to put the joint through a normal range of motion at least 4 times per day. When the pain lessens, begin normal, slow movements and usual activities to help prevent stiffness of the hip.  Only take over-the-counter or prescription medicines for pain, discomfort, or fever as directed by your caregiver.  Use crutches to limit weight bearing on the hip joint, if advised.  Elevate your painful hip to reduce swelling. Use pillows for propping and cushioning your legs and hips.  Gentle massage may provide comfort and decrease swelling. SEEK IMMEDIATE MEDICAL CARE IF:   Your pain increases even during treatment, or you are not improving.  You have a fever.  You have heat and inflammation over the involved bursa.  You have any other questions or  concerns. MAKE SURE YOU:   Understand these instructions.  Will watch your condition.  Will get help right away if you are not doing well or get worse. Document Released: 10/19/2001 Document Revised: 07/22/2011 Document Reviewed: 05/18/2008 [employer] Patient Information 2015 [employer]. This information is not intended to replace advice given to you by your health care provider. Make sure you discuss any questions you have with your health care provider.  

## 2014-08-13 ENCOUNTER — Encounter (HOSPITAL_BASED_OUTPATIENT_CLINIC_OR_DEPARTMENT_OTHER): Payer: Self-pay | Admitting: *Deleted

## 2014-08-13 ENCOUNTER — Emergency Department (HOSPITAL_BASED_OUTPATIENT_CLINIC_OR_DEPARTMENT_OTHER)
Admission: EM | Admit: 2014-08-13 | Discharge: 2014-08-13 | Disposition: A | Discharge status: Home / Self Care | Payer: Commercial Managed Care - PPO | Attending: Emergency Medicine | Admitting: Emergency Medicine

## 2014-08-13 DIAGNOSIS — I10 Essential (primary) hypertension: Secondary | ICD-10-CM | POA: Diagnosis not present

## 2014-08-13 DIAGNOSIS — B349 Viral infection, unspecified: Secondary | ICD-10-CM | POA: Diagnosis not present

## 2014-08-13 DIAGNOSIS — F419 Anxiety disorder, unspecified: Secondary | ICD-10-CM | POA: Diagnosis not present

## 2014-08-13 DIAGNOSIS — Z79899 Other long term (current) drug therapy: Secondary | ICD-10-CM | POA: Diagnosis not present

## 2014-08-13 DIAGNOSIS — Z791 Long term (current) use of non-steroidal anti-inflammatories (NSAID): Secondary | ICD-10-CM | POA: Diagnosis not present

## 2014-08-13 DIAGNOSIS — J029 Acute pharyngitis, unspecified: Secondary | ICD-10-CM

## 2014-08-13 DIAGNOSIS — H1132 Conjunctival hemorrhage, left eye: Secondary | ICD-10-CM | POA: Insufficient documentation

## 2014-08-13 LAB — RAPID STREP SCREEN (MED CTR MEBANE ONLY): Streptococcus, Group A Screen (Direct): NEGATIVE

## 2014-08-13 MED ORDER — CEPHALEXIN 500 MG PO CAPS: 500.0000 mg | ORAL_CAPSULE | Freq: Four times a day (QID) | ORAL | Status: AC

## 2014-08-13 NOTE — Discharge Instructions (Signed)
Keflex as prescribed.  Drink plenty of fluids plenty of rest.  Return to the emergency department if you experience any additional problems.   Upper Respiratory Infection, Adult An upper respiratory infection (URI) is also sometimes known as the common cold. The upper respiratory tract includes the nose, sinuses, throat, trachea, and bronchi. Bronchi are the airways leading to the lungs. Most people improve within 1 week, but symptoms can last up to 2 weeks. A residual cough may last even longer.  CAUSES Many different viruses can infect the tissues lining the upper respiratory tract. The tissues become irritated and inflamed and often become very moist. Mucus production is also common. A cold is contagious. You can easily spread the virus to others by oral contact. This includes kissing, sharing a glass, coughing, or sneezing. Touching your mouth or nose and then touching a surface, which is then touched by another person, can also spread the virus. SYMPTOMS  Symptoms typically develop 1 to 3 days after you come in contact with a cold virus. Symptoms vary from person to person. They may include:  Runny nose.  Sneezing.  Nasal congestion.  Sinus irritation.  Sore throat.  Loss of voice (laryngitis).  Cough.  Fatigue.  Muscle aches.  Loss of appetite.  Headache.  Low-grade fever. DIAGNOSIS  You might diagnose your own cold based on familiar symptoms, since most people get a cold 2 to 3 times a year. Your caregiver can confirm this based on your exam. Most importantly, your caregiver can check that your symptoms are not due to another disease such as strep throat, sinusitis, pneumonia, asthma, or epiglottitis. Blood tests, throat tests, and X-rays are not necessary to diagnose a common cold, but they may sometimes be helpful in excluding other more serious diseases. Your caregiver will decide if any further tests are required. RISKS AND COMPLICATIONS  You may be at risk for a  more severe case of the common cold if you smoke cigarettes, have chronic heart disease (such as heart failure) or lung disease (such as asthma), or if you have a weakened immune system. The very young and very old are also at risk for more serious infections. Bacterial sinusitis, middle ear infections, and bacterial pneumonia can complicate the common cold. The common cold can worsen asthma and chronic obstructive pulmonary disease (COPD). Sometimes, these complications can require emergency medical care and may be life-threatening. PREVENTION  The best way to protect against getting a cold is to practice good hygiene. Avoid oral or hand contact with people with cold symptoms. Wash your hands often if contact occurs. There is no clear evidence that vitamin C, vitamin E, echinacea, or exercise reduces the chance of developing a cold. However, it is always recommended to get plenty of rest and practice good nutrition. TREATMENT  Treatment is directed at relieving symptoms. There is no cure. Antibiotics are not effective, because the infection is caused by a virus, not by bacteria. Treatment may include:  Increased fluid intake. Sports drinks offer valuable electrolytes, sugars, and fluids.  Breathing heated mist or steam (vaporizer or shower).  Eating chicken soup or other clear broths, and maintaining good nutrition.  Getting plenty of rest.  Using gargles or lozenges for comfort.  Controlling fevers with ibuprofen or acetaminophen as directed by your caregiver.  Increasing usage of your inhaler if you have asthma. Zinc gel and zinc lozenges, taken in the first 24 hours of the common cold, can shorten the duration and lessen the severity of symptoms. Pain  medicines may help with fever, muscle aches, and throat pain. A variety of non-prescription medicines are available to treat congestion and runny nose. Your caregiver can make recommendations and may suggest nasal or lung inhalers for other  symptoms.  HOME CARE INSTRUCTIONS   Only take over-the-counter or prescription medicines for pain, discomfort, or fever as directed by your caregiver.  Use a warm mist humidifier or inhale steam from a shower to increase air moisture. This may keep secretions moist and make it easier to breathe.  Drink enough water and fluids to keep your urine clear or pale yellow.  Rest as needed.  Return to work when your temperature has returned to normal or as your caregiver advises. You may need to stay home longer to avoid infecting others. You can also use a face mask and careful hand washing to prevent spread of the virus. SEEK MEDICAL CARE IF:   After the first few days, you feel you are getting worse rather than better.  You need your caregiver's advice about medicines to control symptoms.  You develop chills, worsening shortness of breath, or brown or red sputum. These may be signs of pneumonia.  You develop yellow or brown nasal discharge or pain in the face, especially when you bend forward. These may be signs of sinusitis.  You develop a fever, swollen neck glands, pain with swallowing, or white areas in the back of your throat. These may be signs of strep throat. SEEK IMMEDIATE MEDICAL CARE IF:   You have a fever.  You develop severe or persistent headache, ear pain, sinus pain, or chest pain.  You develop wheezing, a prolonged cough, cough up blood, or have a change in your usual mucus (if you have chronic lung disease).  You develop sore muscles or a stiff neck. Document Released: 10/23/2000 Document Revised: 07/22/2011 Document Reviewed: 08/04/2013 [facility] Patient Information 2015 [place], [place]. This information is not intended to replace advice given to you by your health care provider. Make sure you discuss any questions you have with your health care provider.

## 2014-08-13 NOTE — ED Notes (Signed)
Pt alert, NAD, calm, interactive, steady gait, up to b/r, no dyspnea noted.

## 2014-08-13 NOTE — ED Notes (Signed)
Pt alert, NAD, calm, interactive, c/o bilateral eye irritation and redness, "feel like something in R eye", irrigated with saline gtts at this time, "feel better". Also reports cough congestion and sore throat. Speech clear, no dyspnea. Has had ibuprofen in last 4 hrs.

## 2014-08-13 NOTE — ED Notes (Signed)
C/o cough, sorethroat and nausea. Cough has yellow mucus. Onset last Sunday after [place]. Left eye very red.

## 2014-08-13 NOTE — ED Provider Notes (Signed)
CSN: [id number]     Arrival date & time 08/13/14  1853 History  This chart was scribed for Geoffery Lyonsouglas Rosali Augello, MD by Modena JanskyAlbert Thayil, ED Scribe. This patient was seen in room MH04/MH04 and the patient's care was started at 7:54 PM.   No chief complaint on file.  Patient is a 34 y.o. female presenting with pharyngitis. The history is provided by the patient. No language interpreter was used.  Sore Throat This is a new problem. The current episode started more than 2 days ago. The problem occurs rarely. The problem has not changed since onset.Nothing aggravates the symptoms. Nothing relieves the symptoms. She has tried nothing for the symptoms.    HPI Comments: Jocelyn CraverChristian Lindsey is a 34 y.o. female who presents to the Emergency Department complaining of a constant moderate sore throat that started 6 days ago. She reports that she woke up one morning after a recent trip with a sore throat, nausea, congestion. She states that her symptoms have not been getting better so she came to the ED. She reports no modifying factors. She reports that she has sick contacts. She denies any ear pain.   Past Medical History  Diagnosis Date  . Anxiety   . Hypertension    Past Surgical History  Procedure Laterality Date  . Gallbladder surgery    . Cholecystectomy     Family History  Problem Relation Age of Onset  . Hyperlipidemia Father   . Diabetes Maternal Grandmother   . Heart disease Maternal Grandfather   . Hypertension Paternal Grandmother   . Congestive Heart Failure Paternal Grandfather   . Heart disease Paternal Grandfather    History  Substance Use Topics  . Smoking status: Never Smoker   . Smokeless tobacco: Never Used  . Alcohol Use: No   OB History    No data available     Review of Systems  HENT: Positive for congestion and sore throat. Negative for ear pain.   Gastrointestinal: Positive for nausea.  All other systems reviewed and are negative.  Allergies  Hydrochlorothiazide and  Zofran  Home Medications   Prior to Admission medications   Medication Sig Start Date End Date Taking? Authorizing Provider  ALPRAZolam (XANAX) 0.25 MG tablet Take 0.25 mg by mouth 3 (three) times daily as needed for anxiety or sleep.   Yes Historical Provider, MD  amitriptyline (ELAVIL) 10 MG tablet Take 10 mg by mouth at bedtime as needed for sleep.   Yes Historical Provider, MD  fish oil-omega-3 fatty acids 1000 MG capsule Take 1 g by mouth 2 (two) times daily.    Yes Historical Provider, MD  lisinopril (PRINIVIL,ZESTRIL) 10 MG tablet Take 10 mg by mouth every evening.    Yes Historical Provider, MD  omeprazole (PRILOSEC) 20 MG capsule Take 20 mg by mouth every morning.    Yes Historical Provider, MD  sertraline (ZOLOFT) 100 MG tablet Take 100-150 mg by mouth 2 (two) times daily. Take 100mg  in the morning and 150 mg in the evening   Yes Historical Provider, MD  acetaminophen-codeine (TYLENOL #3) 300-30 MG per tablet Take 1-2 tablets by mouth every 4 (four) hours as needed. 06/09/14   Carmelina DaneJeffery S Anderson, MD  Acetylcysteine (N-ACETYL-L-CYSTEINE) 600 MG CAPS Take 600 mg by mouth 2 (two) times daily.    Historical Provider, MD  naproxen sodium (ANAPROX DS) 550 MG tablet Take 1 tablet (550 mg total) by mouth 2 (two) times daily with a meal. 06/09/14 06/09/15  Carmelina DaneJeffery S Anderson, MD  BP 125/82 mmHg  Pulse 94  Temp(Src) 99.2 F (37.3 C)  Resp 20  Ht  (1.651 m)  Wt 220 lb (99.791 kg)  BMI 36.61 kg/m2  SpO2 97% Physical Exam  Constitutional: She is oriented to person, place, and time. She appears well-developed and well-nourished. No distress.  HENT:  Head: Normocephalic and atraumatic.  Mouth/Throat: No oropharyngeal exudate.  Mild erythema of the PO.   Eyes: Conjunctivae and EOM are normal. Pupils are equal, round, and reactive to light.  Neck: Normal range of motion. Neck supple.  Cardiovascular: Normal rate, regular rhythm, normal heart sounds and intact distal pulses.   No murmur  heard. Pulmonary/Chest: Effort normal and breath sounds normal. No respiratory distress.  Abdominal: Soft. There is no tenderness. There is no rebound and no guarding.  Musculoskeletal: Normal range of motion. She exhibits no edema or tenderness.  Lymphadenopathy:    She has no cervical adenopathy.  Neurological: She is alert and oriented to person, place, and time. No cranial nerve deficit. She exhibits normal muscle tone. Coordination normal.  Skin: Skin is warm.  Psychiatric: She has a normal mood and affect. Her behavior is normal.  Nursing note and vitals reviewed.   ED Course  Procedures (including critical care time) DIAGNOSTIC STUDIES: Oxygen Saturation is 97% on RA, normal by my interpretation.    COORDINATION OF CARE: 7:58 PM- Pt advised of plan for treatment which includes labs and pt agrees.  Labs Review Labs Reviewed - No data to display  Imaging Review No results found.   EKG Interpretation None      MDM   Final diagnoses:  None    Patient is a 34 year old female who presents with a multitude of complaints. She is complaining of blurry vision, sore throat, red eyes, weakness, fatigue. Her physical examination and presentation is most consistent with a viral process. I have explained this to her, however I am not sure how my explanations were received. I have advised her to take ibuprofen, get plenty of rest, drink plenty of fluids, and return for any problems.  She is insisting on medications for this and I have reluctantly agreed to prescribe keflex which she is to fill if not improving in the next two days.  She does have a small sub-conjunctival hemorrhage in her left eye, however this appears uncomplicated and is in need of no emergent treatment.   I personally performed the services described in this documentation, which was scribed in my presence. The recorded information has been reviewed and is accurate.     Geoffery Lyons, MD 08/13/14 2140

## 2014-08-16 LAB — CULTURE, GROUP A STREP: STREP A CULTURE: NEGATIVE

## 2015-07-24 ENCOUNTER — Ambulatory Visit
Admission: RE | Admit: 2015-07-24 | Discharge: 2015-07-24 | Disposition: A | Discharge status: Home / Self Care | Payer: BLUE CROSS/BLUE SHIELD | Source: Ambulatory Visit | Attending: Family Medicine | Admitting: Family Medicine

## 2015-07-24 ENCOUNTER — Other Ambulatory Visit: Payer: Self-pay | Admitting: Family Medicine

## 2015-07-24 DIAGNOSIS — Z0489 Encounter for examination and observation for other specified reasons: Secondary | ICD-10-CM

## 2019-05-03 ENCOUNTER — Ambulatory Visit: Payer: PRIVATE HEALTH INSURANCE | Admitting: Psychiatry

## 2019-05-17 ENCOUNTER — Other Ambulatory Visit: Payer: Self-pay

## 2019-05-17 ENCOUNTER — Encounter: Payer: Self-pay | Admitting: Adult Health

## 2019-05-17 ENCOUNTER — Ambulatory Visit (INDEPENDENT_AMBULATORY_CARE_PROVIDER_SITE_OTHER): Payer: PRIVATE HEALTH INSURANCE | Admitting: Adult Health

## 2019-05-17 DIAGNOSIS — F411 Generalized anxiety disorder: Secondary | ICD-10-CM

## 2019-05-17 DIAGNOSIS — G4700 Insomnia, unspecified: Secondary | ICD-10-CM

## 2019-05-17 DIAGNOSIS — F429 Obsessive-compulsive disorder, unspecified: Secondary | ICD-10-CM | POA: Diagnosis not present

## 2019-05-17 DIAGNOSIS — F331 Major depressive disorder, recurrent, moderate: Secondary | ICD-10-CM

## 2019-05-17 MED ORDER — SERTRALINE HCL 100 MG PO TABS: ORAL_TABLET | ORAL | 1 refills | Status: DC

## 2019-05-17 MED ORDER — ALPRAZOLAM 0.25 MG PO TABS: 0.2500 mg | ORAL_TABLET | Freq: Two times a day (BID) | ORAL | 0 refills | Status: DC | PRN

## 2019-05-17 MED ORDER — AMITRIPTYLINE HCL 10 MG PO TABS: 10.0000 mg | ORAL_TABLET | Freq: Every evening | ORAL | 0 refills | Status: DC | PRN

## 2019-05-17 NOTE — Progress Notes (Signed)
Jocelyn Lindsey [id number] 06/09/1980 39 y.o.  Subjective:   Patient ID:  Jocelyn Lindsey is a 39 y.o. (DOB [birth year]) female.  Chief Complaint:  Chief Complaint  Patient presents with  . Depression  . Anxiety  . Other    OCD    HPI Jocelyn Lindsey presents to the office today for follow-up of MDD, GAD, OCD, and insomnia.  Describes mood today as "ok". Pleasant. Mood symptoms - denies depression, anxiety, and irritability. Stating 'I'm doing pretty good". Feels like "OCD" is manageable. Stating "I'm using my coping skills". Is able to work through "problems" when the arise. Home for the holidays. 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 Father has started dating again and she doesn't worry about him being "lonely". Stable interest and motivation. Taking medications as prescribed.  Energy levels stable. Active, does not have a regular exercise routine. Works full-time as a Pharmacist, hospital. Enjoys some usual interests and activities. Single. Dating. Spending time with family when home. Appetite adequate. Weight stable. Sleeps well most nights. Averages 6 to 8 hours. Focus and concentration stable. Completing tasks. Managing aspects of household. Work going well.  Denies SI or HI. Denies AH or VH.  Review of Systems:  Review of Systems  Musculoskeletal: Negative for gait problem.  Neurological: Negative for tremors.  Psychiatric/Behavioral:       Please refer to HPI    Medications: I have reviewed the patient's current medications.  Current Outpatient Medications  Medication Sig Dispense Refill  . acetaminophen-codeine (TYLENOL #3) 300-30 MG per tablet Take 1-2 tablets by mouth every 4 (four) hours as needed. 30 tablet 0  . Acetylcysteine (N-ACETYL-L-CYSTEINE) 600 MG CAPS Take 600 mg by mouth 2 (two) times daily.    Marland Kitchen ALPRAZolam (XANAX) 0.25 MG tablet Take 1 tablet (0.25 mg total) by mouth 2 (two) times daily as needed for  anxiety or sleep. 180 tablet 0  . amitriptyline (ELAVIL) 10 MG tablet Take 1 tablet (10 mg total) by mouth at bedtime as needed for sleep. 90 tablet 0  . cephALEXin (KEFLEX) 500 MG capsule Take 1 capsule (500 mg total) by mouth 4 (four) times daily. 28 capsule 0  . fish oil-omega-3 fatty acids 1000 MG capsule Take 1 g by mouth 2 (two) times daily.     Marland Kitchen lisinopril (PRINIVIL,ZESTRIL) 10 MG tablet Take 10 mg by mouth every evening.     Marland Kitchen omeprazole (PRILOSEC) 20 MG capsule Take 20 mg by mouth every morning.     . sertraline (ZOLOFT) 100 MG tablet Take 100mg  in the morning and 150 mg in the evening. 450 tablet 1   No current facility-administered medications for this visit.    Medication Side Effects: None  Allergies:  Allergies  Allergen Reactions  . Hydrochlorothiazide Hypertension    Reverse effect - increased BP Reverse effect - increased BP  . Other Other (See Comments)    Severe constipation  . Zofran [Ondansetron Hcl] Other (See Comments)    Severe constipation    Past Medical History:  Diagnosis Date  . Anxiety   . Hypertension     Family History  Problem Relation Age of Onset  . Hyperlipidemia Father   . Diabetes Maternal Grandmother   . Heart disease Maternal Grandfather   . Hypertension Paternal Grandmother   . Congestive Heart Failure Paternal Grandfather   . Heart disease Paternal Grandfather     Social History   Socioeconomic History  . Marital status: Single  Spouse name: Not on file  . Number of children: Not on file  . Years of education: Not on file  . Highest education level: Not on file  Occupational History  . Not on file  Tobacco Use  . Smoking status: Never Smoker  . Smokeless tobacco: Never Used  Substance and Sexual Activity  . Alcohol use: No    Alcohol/week: 0.0 standard drinks  . Drug use: No  . Sexual activity: Not on file  Other Topics Concern  . Not on file  Social History Narrative  . Not on file   Social Determinants of  Health   Financial Resource Strain:   . Difficulty of Paying Living Expenses: Not on file  Food Insecurity:   . Worried About Programme researcher, broadcasting/film/video in the Last Year: Not on file  . Ran Out of Food in the Last Year: Not on file  Transportation Needs:   . Lack of Transportation (Medical): Not on file  . Lack of Transportation (Non-Medical): Not on file  Physical Activity:   . Days of Exercise per Week: Not on file  . Minutes of Exercise per Session: Not on file  Stress:   . Feeling of Stress : Not on file  Social Connections:   . Frequency of Communication with Friends and Family: Not on file  . Frequency of Social Gatherings with Friends and Family: Not on file  . Attends Religious Services: Not on file  . Active Member of Clubs or Organizations: Not on file  . Attends Banker Meetings: Not on file  . Marital Status: Not on file  Intimate Partner Violence:   . Fear of Current or Ex-Partner: Not on file  . Emotionally Abused: Not on file  . Physically Abused: Not on file  . Sexually Abused: Not on file    Past Medical History, Surgical history, Social history, and Family history were reviewed and updated as appropriate.   Please see review of systems for further details on the patient's review from today.   Objective:   Physical Exam:  There were no vitals taken for this visit.  Physical Exam Constitutional:      General: She is not in acute distress.    Appearance: She is well-developed.  Musculoskeletal:        General: No deformity.  Neurological:     Mental Status: She is alert and oriented to person, place, and time.     Coordination: Coordination normal.  Psychiatric:        Attention and Perception: Attention and perception normal. She does not perceive auditory or visual hallucinations.        Mood and Affect: Mood normal. Mood is not anxious or depressed. Affect is not labile, blunt, angry or inappropriate.        Speech: Speech normal.         Behavior: Behavior normal.        Thought Content: Thought content normal. Thought content is not paranoid or delusional. Thought content does not include homicidal or suicidal ideation. Thought content does not include homicidal or suicidal plan.        Cognition and Memory: Cognition and memory normal.        Judgment: Judgment normal.     Comments: Insight intact     Lab Review:     Component Value Date/Time   NA 137 05/01/2013 0730   K 3.7 05/01/2013 0730   CL 104 05/01/2013 0730   CO2 25 05/01/2013 0730  GLUCOSE 105 (H) 05/01/2013 0730   BUN 17 05/01/2013 0730   CREATININE 0.75 05/01/2013 0730   CALCIUM 8.8 05/01/2013 0730   PROT 6.6 05/01/2013 0730   ALBUMIN 3.6 05/01/2013 0730   AST 22 05/01/2013 0730   ALT 25 05/01/2013 0730   ALKPHOS 58 05/01/2013 0730   BILITOT 0.3 05/01/2013 0730   GFRNONAA >90 05/01/2013 0730   GFRAA >90 05/01/2013 0730       Component Value Date/Time   WBC 4.8 05/01/2013 0730   RBC 4.40 05/01/2013 0730   HGB 13.4 05/01/2013 0730   HCT 39.0 05/01/2013 0730   PLT 207 05/01/2013 0730   MCV 88.6 05/01/2013 0730   MCV 95.9 04/30/2013 1407   MCH 30.5 05/01/2013 0730   MCHC 34.4 05/01/2013 0730   RDW 12.8 05/01/2013 0730   LYMPHSABS 0.9 05/01/2013 0730   MONOABS 0.5 05/01/2013 0730   EOSABS 0.1 05/01/2013 0730   BASOSABS 0.0 05/01/2013 0730    No results found for: POCLITH, LITHIUM   No results found for: PHENYTOIN, PHENOBARB, VALPROATE, CBMZ   .res Assessment: Plan:    Plan:  1. Zoloft mg tablet - Take 2 and 1/2 tablets daily 2. Xanax 0.25mg  tablet at hs prn sleep. 3. Amitriptyline 10mg  at hs  RTC 6 months to one year  Patient advised to contact office with any questions, adverse effects, or acute worsening in signs and symptoms.  Discussed potential benefits, risk, and side effects of benzodiazepines to include potential risk of tolerance and dependence, as well as possible drowsiness.  Advised patient not to drive if  experiencing drowsiness and to take lowest possible effective dose to minimize risk of dependence and tolerance.  Jocelyn Lindsey was seen today for depression, anxiety and other.  Diagnoses and all orders for this visit:  Major depressive disorder, recurrent episode, moderate (HCC) -     sertraline (ZOLOFT) 100 MG tablet; Take 100mg  in the morning and 150 mg in the evening.  Obsessive-compulsive disorder, unspecified type -     sertraline (ZOLOFT) 100 MG tablet; Take 100mg  in the morning and 150 mg in the evening.  Generalized anxiety disorder -     sertraline (ZOLOFT) 100 MG tablet; Take 100mg  in the morning and 150 mg in the evening. -     ALPRAZolam (XANAX) 0.25 MG tablet; Take 1 tablet (0.25 mg total) by mouth 2 (two) times daily as needed for anxiety or sleep.  Insomnia, unspecified type -     ALPRAZolam (XANAX) 0.25 MG tablet; Take 1 tablet (0.25 mg total) by mouth 2 (two) times daily as needed for anxiety or sleep. -     amitriptyline (ELAVIL) 10 MG tablet; Take 1 tablet (10 mg total) by mouth at bedtime as needed for sleep.     Please see After Visit Summary for patient specific instructions.  No future appointments.  No orders of the defined types were placed in this encounter.   -------------------------------

## 2020-03-02 ENCOUNTER — Encounter: Payer: Self-pay | Admitting: Psychiatry

## 2020-05-01 ENCOUNTER — Other Ambulatory Visit: Payer: Self-pay

## 2020-05-01 ENCOUNTER — Ambulatory Visit (INDEPENDENT_AMBULATORY_CARE_PROVIDER_SITE_OTHER): Payer: 59 | Admitting: Adult Health

## 2020-05-01 ENCOUNTER — Encounter: Payer: Self-pay | Admitting: Adult Health

## 2020-05-01 DIAGNOSIS — F429 Obsessive-compulsive disorder, unspecified: Secondary | ICD-10-CM

## 2020-05-01 DIAGNOSIS — G4700 Insomnia, unspecified: Secondary | ICD-10-CM | POA: Diagnosis not present

## 2020-05-01 DIAGNOSIS — F331 Major depressive disorder, recurrent, moderate: Secondary | ICD-10-CM

## 2020-05-01 DIAGNOSIS — F411 Generalized anxiety disorder: Secondary | ICD-10-CM | POA: Diagnosis not present

## 2020-05-01 MED ORDER — ALPRAZOLAM 0.25 MG PO TABS: 0.2500 mg | ORAL_TABLET | Freq: Two times a day (BID) | ORAL | 0 refills | Status: DC | PRN

## 2020-05-01 MED ORDER — AMITRIPTYLINE HCL 10 MG PO TABS: 10.0000 mg | ORAL_TABLET | Freq: Every evening | ORAL | 0 refills | Status: AC | PRN

## 2020-05-01 MED ORDER — SERTRALINE HCL 100 MG PO TABS: ORAL_TABLET | ORAL | 1 refills | Status: DC

## 2020-05-01 NOTE — Progress Notes (Signed)
Jocelyn Lindsey [id number] 1980-07-02 39 y.o.  Subjective:   Patient ID:  Jocelyn Lindsey is a 39 y.o. (DOB Oct 18, 1980) female.  Chief Complaint: No chief complaint on file.   HPI Jocelyn Lindsey presents to the office today for follow-up of MDD, GAD, OCD, and insomnia.  Describes mood today as "ok". Pleasant. Mood symptoms - denies depression, anxiety, and irritability. Stating "I'm doing pretty good". 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  Struggling with obsessions related to recent health concern. Home for the holidays - spending time with father. Stable interest and motivation. Taking medications as prescribed.  Energy levels stable. Active, has a regular exercise routine. Dispensing optician. Enjoys some usual interests and activities. Single. Dating. Spending time with family when home. Appetite adequate. Weight stable. Sleeps well most nights. Averages 6 to 8 hours. Focus and concentration stable. Completing tasks. Managing aspects of household. Work going well. Works full-time as a Runner, broadcasting/film/video - [place] Denies SI or HI.  Denies AH or VH   Review of Systems:  Review of Systems  Musculoskeletal: Negative for gait problem.  Neurological: Negative for tremors.  Psychiatric/Behavioral:       Please refer to HPI    Medications: I have reviewed the patient's current medications.  Current Outpatient Medications  Medication Sig Dispense Refill   acetaminophen-codeine (TYLENOL #3) 300-30 MG per tablet Take 1-2 tablets by mouth every 4 (four) hours as needed. 30 tablet 0   Acetylcysteine (N-ACETYL-L-CYSTEINE) 600 MG CAPS Take 600 mg by mouth 2 (two) times daily.     ALPRAZolam (XANAX) 0.25 MG tablet Take 1 tablet (0.25 mg total) by mouth 2 (two) times daily as needed for anxiety or sleep. 180 tablet 0   amitriptyline (ELAVIL) 10 MG tablet Take 1 tablet (10 mg total) by mouth at bedtime as needed for sleep. 30  tablet 0   cephALEXin (KEFLEX) 500 MG capsule Take 1 capsule (500 mg total) by mouth 4 (four) times daily. 28 capsule 0   fish oil-omega-3 fatty acids 1000 MG capsule Take 1 g by mouth 2 (two) times daily.      lisinopril (PRINIVIL,ZESTRIL) 10 MG tablet Take 10 mg by mouth every evening.      omeprazole (PRILOSEC) 20 MG capsule Take 20 mg by mouth every morning.      sertraline (ZOLOFT) 100 MG tablet Take 100mg  in the morning and 150 mg in the evening. 450 tablet 1   No current facility-administered medications for this visit.    Medication Side Effects: None  Allergies:  Allergies  Allergen Reactions   Hydrochlorothiazide Hypertension    Reverse effect - increased BP Reverse effect - increased BP   Other Other (See Comments)    Severe constipation   Zofran [Ondansetron Hcl] Other (See Comments)    Severe constipation    Past Medical History:  Diagnosis Date   Anxiety    Hypertension     Family History  Problem Relation Age of Onset   Hyperlipidemia Father    Diabetes Maternal Grandmother    Heart disease Maternal Grandfather    Hypertension Paternal Grandmother    Congestive Heart Failure Paternal Grandfather    Heart disease Paternal Grandfather     Social History   Socioeconomic History   Marital status: Single    Spouse name: Not on file   Number of children: Not on file   Years of education: Not on file   Highest education level: Not on  file  Occupational History   Not on file  Tobacco Use   Smoking status: Never Smoker   Smokeless tobacco: Never Used  Substance and Sexual Activity   Alcohol use: No    Alcohol/week: 0.0 standard drinks   Drug use: No   Sexual activity: Not on file  Other Topics Concern   Not on file  Social History Narrative   Not on file   Social Determinants of Health   Financial Resource Strain: Not on file  Food Insecurity: Not on file  Transportation Needs: Not on file  Physical Activity: Not on  file  Stress: Not on file  Social Connections: Not on file  Intimate Partner Violence: Not on file    Past Medical History, Surgical history, Social history, and Family history were reviewed and updated as appropriate.   Please see review of systems for further details on the patient's review from today.   Objective:   Physical Exam:  There were no vitals taken for this visit.  Physical Exam Constitutional:      General: She is not in acute distress. Musculoskeletal:        General: No deformity.  Neurological:     Mental Status: She is alert and oriented to person, place, and time.     Coordination: Coordination normal.  Psychiatric:        Attention and Perception: Attention and perception normal. She does not perceive auditory or visual hallucinations.        Mood and Affect: Mood normal. Mood is not anxious or depressed. Affect is not labile, blunt, angry or inappropriate.        Speech: Speech normal.        Behavior: Behavior normal.        Thought Content: Thought content normal. Thought content is not paranoid or delusional. Thought content does not include homicidal or suicidal ideation. Thought content does not include homicidal or suicidal plan.        Cognition and Memory: Cognition and memory normal.        Judgment: Judgment normal.     Comments: Insight intact     Lab Review:     Component Value Date/Time   NA 137 05/01/2013 0730   K 3.7 05/01/2013 0730   CL 104 05/01/2013 0730   CO2 25 05/01/2013 0730   GLUCOSE 105 (H) 05/01/2013 0730   BUN 17 05/01/2013 0730   CREATININE 0.75 05/01/2013 0730   CALCIUM 8.8 05/01/2013 0730   PROT 6.6 05/01/2013 0730   ALBUMIN 3.6 05/01/2013 0730   AST 22 05/01/2013 0730   ALT 25 05/01/2013 0730   ALKPHOS 58 05/01/2013 0730   BILITOT 0.3 05/01/2013 0730   GFRNONAA >90 05/01/2013 0730   GFRAA >90 05/01/2013 0730       Component Value Date/Time   WBC 4.8 05/01/2013 0730   RBC 4.40 05/01/2013 0730   HGB 13.4  05/01/2013 0730   HCT 39.0 05/01/2013 0730   PLT 207 05/01/2013 0730   MCV 88.6 05/01/2013 0730   MCV 95.9 04/30/2013 1407   MCH 30.5 05/01/2013 0730   MCHC 34.4 05/01/2013 0730   RDW 12.8 05/01/2013 0730   LYMPHSABS 0.9 05/01/2013 0730   MONOABS 0.5 05/01/2013 0730   EOSABS 0.1 05/01/2013 0730   BASOSABS 0.0 05/01/2013 0730    No results found for: POCLITH, LITHIUM   No results found for: PHENYTOIN, PHENOBARB, VALPROATE, CBMZ   .res Assessment: Plan:    Plan:  1. Zoloft mg tablet -  Take 2 and 1/2 tablets daily 2. Xanax 0.25mg  tablet at hs prn sleep. 3. Amitriptyline 10mg  at hs  RTC 6 months to one year  Patient advised to contact office with any questions, adverse effects, or acute worsening in signs and symptoms.  Discussed potential benefits, risk, and side effects of benzodiazepines to include potential risk of tolerance and dependence, as well as possible drowsiness.  Advised patient not to drive if experiencing drowsiness and to take lowest possible effective dose to minimize risk of dependence and tolerance.    Diagnoses and all orders for this visit:  Major depressive disorder, recurrent episode, moderate (HCC) -     sertraline (ZOLOFT) 100 MG tablet; Take 100mg  in the morning and 150 mg in the evening.  Obsessive-compulsive disorder, unspecified type -     sertraline (ZOLOFT) 100 MG tablet; Take 100mg  in the morning and 150 mg in the evening.  Generalized anxiety disorder -     sertraline (ZOLOFT) 100 MG tablet; Take 100mg  in the morning and 150 mg in the evening. -     ALPRAZolam (XANAX) 0.25 MG tablet; Take 1 tablet (0.25 mg total) by mouth 2 (two) times daily as needed for anxiety or sleep.  Insomnia, unspecified type -     ALPRAZolam (XANAX) 0.25 MG tablet; Take 1 tablet (0.25 mg total) by mouth 2 (two) times daily as needed for anxiety or sleep. -     amitriptyline (ELAVIL) 10 MG tablet; Take 1 tablet (10 mg total) by mouth at bedtime as needed for  sleep.     Please see After Visit Summary for patient specific instructions.  No future appointments.  No orders of the defined types were placed in this encounter.   -------------------------------

## 2020-10-16 ENCOUNTER — Telehealth (INDEPENDENT_AMBULATORY_CARE_PROVIDER_SITE_OTHER): Payer: 59 | Admitting: Adult Health

## 2020-10-16 ENCOUNTER — Encounter: Payer: Self-pay | Admitting: Adult Health

## 2020-10-16 DIAGNOSIS — F411 Generalized anxiety disorder: Secondary | ICD-10-CM

## 2020-10-16 DIAGNOSIS — F331 Major depressive disorder, recurrent, moderate: Secondary | ICD-10-CM | POA: Diagnosis not present

## 2020-10-16 DIAGNOSIS — G4700 Insomnia, unspecified: Secondary | ICD-10-CM

## 2020-10-16 DIAGNOSIS — F429 Obsessive-compulsive disorder, unspecified: Secondary | ICD-10-CM | POA: Diagnosis not present

## 2020-10-16 MED ORDER — ALPRAZOLAM 0.25 MG PO TABS: 0.2500 mg | ORAL_TABLET | Freq: Two times a day (BID) | ORAL | 0 refills | Status: DC | PRN

## 2020-10-16 MED ORDER — SERTRALINE HCL 100 MG PO TABS: ORAL_TABLET | ORAL | 1 refills | Status: DC

## 2020-10-16 NOTE — Progress Notes (Signed)
Jocelyn Lindsey [id number] 02-Jan-1981 40 y.o.  Virtual Visit via Video Note  I connected with pt @ on 10/16/20 at 10:00 AM EDT by a video enabled telemedicine application and verified that I am speaking with the correct person using two identifiers.   I discussed the limitations of evaluation and management by telemedicine and the availability of in person appointments. The patient expressed understanding and agreed to proceed.  I discussed the assessment and treatment plan with the patient. The patient was provided an opportunity to ask questions and all were answered. The patient agreed with the plan and demonstrated an understanding of the instructions.   The patient was advised to call back or seek an in-person evaluation if the symptoms worsen or if the condition fails to improve as anticipated.  I provided 30 minutes of non-face-to-face time during this encounter.  The patient was located at home.  The provider was located at [facility].   Dorothyann Gibbs, NP   Subjective:   Patient ID:  Jocelyn Lindsey is a 40 y.o. (DOB 1981/02/01) female.  Chief Complaint: No chief complaint on file.   HPI Jocelyn Lindsey presents for follow-up of MDD, GAD, OCD, and insomnia.  Describes mood today as "ok". Pleasant. Mood symptoms - denies depression, anxiety, and irritability. Stating "I'm happy". 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 Traveling back and forth from [place]. Father living in [place]. Struggling with obsessions related to health concern.  Recovered from H pylori - recovered and then had bacterial infection - followed by the flu. Stable interest and motivation. Taking medications as prescribed.  Energy levels improvement. Active, has a regular exercise routine. Dispensing optician. Enjoys some usual interests and activities. Single. Dating - not curently. Spending time with family when home. Appetite adequate. Weight loss. Sleeps well most nights.  Averages 6 to 8 hours. Focus and concentration stable. Completing tasks. Managing aspects of household. Work going well. Works full-time as a Runner, broadcasting/film/video - [place] Denies SI or HI.  Denies AH or VH  Review of Systems:  Review of Systems  Musculoskeletal: Negative for gait problem.  Neurological: Negative for tremors.  Psychiatric/Behavioral:       Please refer to HPI    Medications: I have reviewed the patient's current medications.  Current Outpatient Medications  Medication Sig Dispense Refill  . acetaminophen-codeine (TYLENOL #3) 300-30 MG per tablet Take 1-2 tablets by mouth every 4 (four) hours as needed. 30 tablet 0  . Acetylcysteine (N-ACETYL-L-CYSTEINE) 600 MG CAPS Take 600 mg by mouth 2 (two) times daily.    Marland Kitchen ALPRAZolam (XANAX) 0.25 MG tablet Take 1 tablet (0.25 mg total) by mouth 2 (two) times daily as needed for anxiety or sleep. 180 tablet 0  . amitriptyline (ELAVIL) 10 MG tablet Take 1 tablet (10 mg total) by mouth at bedtime as needed for sleep. 30 tablet 0  . cephALEXin (KEFLEX) 500 MG capsule Take 1 capsule (500 mg total) by mouth 4 (four) times daily. 28 capsule 0  . fish oil-omega-3 fatty acids 1000 MG capsule Take 1 g by mouth 2 (two) times daily.     Marland Kitchen lisinopril (PRINIVIL,ZESTRIL) 10 MG tablet Take 10 mg by mouth every evening.     Marland Kitchen omeprazole (PRILOSEC) 20 MG capsule Take 20 mg by mouth every morning.     . sertraline (ZOLOFT) 100 MG tablet Take 100mg  in the morning and 150 mg in the evening. 450 tablet 1   No current facility-administered medications for this visit.    Medication  Side Effects: None  Allergies:  Allergies  Allergen Reactions  . Hydrochlorothiazide Hypertension    Reverse effect - increased BP Reverse effect - increased BP  . Other Other (See Comments)    Severe constipation  . Zofran [Ondansetron Hcl] Other (See Comments)    Severe constipation    Past Medical History:  Diagnosis Date  . Anxiety   . Hypertension     Family  History  Problem Relation Age of Onset  . Hyperlipidemia Father   . Diabetes Maternal Grandmother   . Heart disease Maternal Grandfather   . Hypertension Paternal Grandmother   . Congestive Heart Failure Paternal Grandfather   . Heart disease Paternal Grandfather     Social History   Socioeconomic History  . Marital status: Single    Spouse name: Not on file  . Number of children: Not on file  . Years of education: Not on file  . Highest education level: Not on file  Occupational History  . Not on file  Tobacco Use  . Smoking status: Never Smoker  . Smokeless tobacco: Never Used  Substance and Sexual Activity  . Alcohol use: No    Alcohol/week: 0.0 standard drinks  . Drug use: No  . Sexual activity: Not on file  Other Topics Concern  . Not on file  Social History Narrative  . Not on file   Social Determinants of Health   Financial Resource Strain: Not on file  Food Insecurity: Not on file  Transportation Needs: Not on file  Physical Activity: Not on file  Stress: Not on file  Social Connections: Not on file  Intimate Partner Violence: Not on file    Past Medical History, Surgical history, Social history, and Family history were reviewed and updated as appropriate.   Please see review of systems for further details on the patient's review from today.   Objective:   Physical Exam:  There were no vitals taken for this visit.  Physical Exam Constitutional:      General: She is not in acute distress. Musculoskeletal:        General: No deformity.  Neurological:     Mental Status: She is alert and oriented to person, place, and time.     Coordination: Coordination normal.  Psychiatric:        Attention and Perception: Attention and perception normal. She does not perceive auditory or visual hallucinations.        Mood and Affect: Mood normal. Mood is not anxious or depressed. Affect is not labile, blunt, angry or inappropriate.        Speech: Speech normal.         Behavior: Behavior normal.        Thought Content: Thought content normal. Thought content is not paranoid or delusional. Thought content does not include homicidal or suicidal ideation. Thought content does not include homicidal or suicidal plan.        Cognition and Memory: Cognition and memory normal.        Judgment: Judgment normal.     Comments: Insight intact     Lab Review:     Component Value Date/Time   NA 137 05/01/2013 0730   K 3.7 05/01/2013 0730   CL 104 05/01/2013 0730   CO2 25 05/01/2013 0730   GLUCOSE 105 (H) 05/01/2013 0730   BUN 17 05/01/2013 0730   CREATININE 0.75 05/01/2013 0730   CALCIUM 8.8 05/01/2013 0730   PROT 6.6 05/01/2013 0730   ALBUMIN 3.6 05/01/2013  0730   AST 22 05/01/2013 0730   ALT 25 05/01/2013 0730   ALKPHOS 58 05/01/2013 0730   BILITOT 0.3 05/01/2013 0730   GFRNONAA >90 05/01/2013 0730   GFRAA >90 05/01/2013 0730       Component Value Date/Time   WBC 4.8 05/01/2013 0730   RBC 4.40 05/01/2013 0730   HGB 13.4 05/01/2013 0730   HCT 39.0 05/01/2013 0730   PLT 207 05/01/2013 0730   MCV 88.6 05/01/2013 0730   MCV 95.9 04/30/2013 1407   MCH 30.5 05/01/2013 0730   MCHC 34.4 05/01/2013 0730   RDW 12.8 05/01/2013 0730   LYMPHSABS 0.9 05/01/2013 0730   MONOABS 0.5 05/01/2013 0730   EOSABS 0.1 05/01/2013 0730   BASOSABS 0.0 05/01/2013 0730    No results found for: POCLITH, LITHIUM   No results found for: PHENYTOIN, PHENOBARB, VALPROATE, CBMZ   .res Assessment: Plan:    Plan:  1. Zoloft mg tablet - Take 2 and 1/2 tablets daily 2. Xanax 0.25mg  tablet at hs prn sleep. 3. Amitriptyline 10mg  at hs  RTC 6 months to one year  Patient advised to contact office with any questions, adverse effects, or acute worsening in signs and symptoms.  Discussed potential benefits, risk, and side effects of benzodiazepines to include potential risk of tolerance and dependence, as well as possible drowsiness.  Advised patient not to drive if  experiencing drowsiness and to take lowest possible effective dose to minimize risk of dependence and tolerance.    Diagnoses and all orders for this visit:  Insomnia, unspecified type  Major depressive disorder, recurrent episode, moderate (HCC)  Obsessive-compulsive disorder, unspecified type  Generalized anxiety disorder     Please see After Visit Summary for patient specific instructions.  No future appointments.  No orders of the defined types were placed in this encounter.     -------------------------------

## 2021-04-30 ENCOUNTER — Telehealth (INDEPENDENT_AMBULATORY_CARE_PROVIDER_SITE_OTHER): Payer: Managed Care, Other (non HMO) | Admitting: Adult Health

## 2021-04-30 ENCOUNTER — Encounter: Payer: Self-pay | Admitting: Adult Health

## 2021-04-30 DIAGNOSIS — F429 Obsessive-compulsive disorder, unspecified: Secondary | ICD-10-CM

## 2021-04-30 DIAGNOSIS — F331 Major depressive disorder, recurrent, moderate: Secondary | ICD-10-CM | POA: Diagnosis not present

## 2021-04-30 DIAGNOSIS — F411 Generalized anxiety disorder: Secondary | ICD-10-CM | POA: Diagnosis not present

## 2021-04-30 DIAGNOSIS — G4700 Insomnia, unspecified: Secondary | ICD-10-CM

## 2021-04-30 MED ORDER — SERTRALINE HCL 100 MG PO TABS: ORAL_TABLET | ORAL | 1 refills | Status: AC

## 2021-04-30 MED ORDER — ALPRAZOLAM 0.25 MG PO TABS
0.2500 mg | ORAL_TABLET | Freq: Two times a day (BID) | ORAL | 0 refills | Status: AC | PRN
Start: 2021-04-30 — End: ?

## 2021-04-30 NOTE — Progress Notes (Signed)
[name] [id number] 01-22-1981 40 y.o.  Virtual Visit via Video Note  I connected with pt @ on 04/30/21 at  2:40 PM EST by a video enabled telemedicine application and verified that I am speaking with the correct person using two identifiers.   I discussed the limitations of evaluation and management by telemedicine and the availability of in person appointments. The patient expressed understanding and agreed to proceed.  I discussed the assessment and treatment plan with the patient. The patient was provided an opportunity to ask questions and all were answered. The patient agreed with the plan and demonstrated an understanding of the instructions.   The patient was advised to call back or seek an in-person evaluation if the symptoms worsen or if the condition fails to improve as anticipated.  I provided 25 minutes of non-face-to-face time during this encounter.  The patient was located at home.  The provider was located at [facility].   Dorothyann Gibbs, NP   Subjective:   Patient ID:  Jocelyn Lindsey is a 40 y.o. (DOB Mar 04, 1981) female.  Chief Complaint: No chief complaint on file.   HPI Jocelyn Lindsey presents for follow-up of MDD, GAD, OCD, and insomnia.  Describes mood today as "ok". Pleasant. Mood symptoms - reports "some" depression, anxiety, and irritability. Stating "I'm doing ok". Denies panic attacks for the past 10 years, but felt like she came close recently. 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 Traveling back and forth from [place]. Having issues in work setting. Looking for a new opportunities in the [place]. Wants to be closer to father - worried about his health. Reports some health issues - 4 kidney stones surgically removed. Stable interest and motivation. Taking medications as prescribed.  Energy levels stable. Active, has a regular exercise routine.  Enjoys some usual interests and activities. Single. Dating - not curently. Spending  time with family when home. Appetite adequate. Weight stable. Sleeps well most nights. Averages 6 to 8 hours. Focus and concentration stable. Completing tasks. Managing aspects of household. Works full-time as a Runner, broadcasting/film/video - [place] Denies SI or HI.  Denies AH or VH   Review of Systems:  Review of Systems  Musculoskeletal:  Negative for gait problem.  Neurological:  Negative for tremors.  Psychiatric/Behavioral:         Please refer to HPI   Medications: I have reviewed the patient's current medications.  Current Outpatient Medications  Medication Sig Dispense Refill   acetaminophen-codeine (TYLENOL #3) 300-30 MG per tablet Take 1-2 tablets by mouth every 4 (four) hours as needed. 30 tablet 0   Acetylcysteine (N-ACETYL-L-CYSTEINE) 600 MG CAPS Take 600 mg by mouth 2 (two) times daily.     ALPRAZolam (XANAX) 0.25 MG tablet Take 1 tablet (0.25 mg total) by mouth 2 (two) times daily as needed for anxiety or sleep. 180 tablet 0   amitriptyline (ELAVIL) 10 MG tablet Take 1 tablet (10 mg total) by mouth at bedtime as needed for sleep. 30 tablet 0   cephALEXin (KEFLEX) 500 MG capsule Take 1 capsule (500 mg total) by mouth 4 (four) times daily. 28 capsule 0   fish oil-omega-3 fatty acids 1000 MG capsule Take 1 g by mouth 2 (two) times daily.      lisinopril (PRINIVIL,ZESTRIL) 10 MG tablet Take 10 mg by mouth every evening.      omeprazole (PRILOSEC) 20 MG capsule Take 20 mg by mouth every morning.      sertraline (ZOLOFT) 100 MG tablet Take 100mg  in  the morning and 150 mg in the evening. 450 tablet 1   No current facility-administered medications for this visit.    Medication Side Effects: None  Allergies:  Allergies  Allergen Reactions   Hydrochlorothiazide Hypertension    Reverse effect - increased BP Reverse effect - increased BP   Other Other (See Comments)    Severe constipation   Zofran [Ondansetron Hcl] Other (See Comments)    Severe constipation    Past Medical History:   Diagnosis Date   Anxiety    Hypertension     Family History  Problem Relation Age of Onset   Hyperlipidemia Father    Diabetes Maternal Grandmother    Heart disease Maternal Grandfather    Hypertension Paternal Grandmother    Congestive Heart Failure Paternal Grandfather    Heart disease Paternal Grandfather     Social History   Socioeconomic History   Marital status: Single    Spouse name: Not on file   Number of children: Not on file   Years of education: Not on file   Highest education level: Not on file  Occupational History   Not on file  Tobacco Use   Smoking status: Never   Smokeless tobacco: Never  Substance and Sexual Activity   Alcohol use: No    Alcohol/week: 0.0 standard drinks   Drug use: No   Sexual activity: Not on file  Other Topics Concern   Not on file  Social History Narrative   Not on file   Social Determinants of Health   Financial Resource Strain: Not on file  Food Insecurity: Not on file  Transportation Needs: Not on file  Physical Activity: Not on file  Stress: Not on file  Social Connections: Not on file  Intimate Partner Violence: Not on file    Past Medical History, Surgical history, Social history, and Family history were reviewed and updated as appropriate.   Please see review of systems for further details on the patient's review from today.   Objective:   Physical Exam:  There were no vitals taken for this visit.  Physical Exam Constitutional:      General: She is not in acute distress. Musculoskeletal:        General: No deformity.  Neurological:     Mental Status: She is alert and oriented to person, place, and time.     Coordination: Coordination normal.  Psychiatric:        Attention and Perception: Attention and perception normal. She does not perceive auditory or visual hallucinations.        Mood and Affect: Mood normal. Mood is not anxious or depressed. Affect is not labile, blunt, angry or inappropriate.         Speech: Speech normal.        Behavior: Behavior normal.        Thought Content: Thought content normal. Thought content is not paranoid or delusional. Thought content does not include homicidal or suicidal ideation. Thought content does not include homicidal or suicidal plan.        Cognition and Memory: Cognition and memory normal.        Judgment: Judgment normal.     Comments: Insight intact    Lab Review:     Component Value Date/Time   NA 137 05/01/2013 0730   K 3.7 05/01/2013 0730   CL 104 05/01/2013 0730   CO2 25 05/01/2013 0730   GLUCOSE 105 (H) 05/01/2013 0730   BUN 17 05/01/2013 0730  CREATININE 0.75 05/01/2013 0730   CALCIUM 8.8 05/01/2013 0730   PROT 6.6 05/01/2013 0730   ALBUMIN 3.6 05/01/2013 0730   AST 22 05/01/2013 0730   ALT 25 05/01/2013 0730   ALKPHOS 58 05/01/2013 0730   BILITOT 0.3 05/01/2013 0730   GFRNONAA >90 05/01/2013 0730   GFRAA >90 05/01/2013 0730       Component Value Date/Time   WBC 4.8 05/01/2013 0730   RBC 4.40 05/01/2013 0730   HGB 13.4 05/01/2013 0730   HCT 39.0 05/01/2013 0730   PLT 207 05/01/2013 0730   MCV 88.6 05/01/2013 0730   MCV 95.9 04/30/2013 1407   MCH 30.5 05/01/2013 0730   MCHC 34.4 05/01/2013 0730   RDW 12.8 05/01/2013 0730   LYMPHSABS 0.9 05/01/2013 0730   MONOABS 0.5 05/01/2013 0730   EOSABS 0.1 05/01/2013 0730   BASOSABS 0.0 05/01/2013 0730    No results found for: POCLITH, LITHIUM   No results found for: PHENYTOIN, PHENOBARB, VALPROATE, CBMZ   .res Assessment: Plan:    Plan:  1. Zoloft mg tablet - Take 2 and 1/2 tablets daily 2. Xanax 0.25mg  tablet at hs prn sleep. 3. Amitriptyline 10mg  at hs  RTC 6 months to one year  Patient advised to contact office with any questions, adverse effects, or acute worsening in signs and symptoms.  Discussed potential benefits, risk, and side effects of benzodiazepines to include potential risk of tolerance and dependence, as well as possible drowsiness.  Advised  patient not to drive if experiencing drowsiness and to take lowest possible effective dose to minimize risk of dependence and tolerance.    Diagnoses and all orders for this visit:  Major depressive disorder, recurrent episode, moderate (HCC)  Obsessive-compulsive disorder, unspecified type  Generalized anxiety disorder  Insomnia, unspecified type     Please see After Visit Summary for patient specific instructions.  No future appointments.   No orders of the defined types were placed in this encounter.     -------------------------------
